# Patient Record
Sex: Male | Born: 1985 | Hispanic: Yes | Marital: Single | Smoking: Current every day smoker
Health system: Southern US, Community
[De-identification: ages and names within clinical notes are randomized; demographics above are authoritative.]

## PROBLEM LIST (undated history)

## (undated) DIAGNOSIS — F1110 Opioid abuse, uncomplicated: Secondary | ICD-10-CM

---

## 2008-05-28 ENCOUNTER — Emergency Department (HOSPITAL_COMMUNITY): Admission: EM | Admit: 2008-05-28 | Discharge: 2008-05-28 | Payer: Self-pay | Admitting: Emergency Medicine

## 2009-03-14 ENCOUNTER — Emergency Department (HOSPITAL_COMMUNITY): Admission: EM | Admit: 2009-03-14 | Discharge: 2009-03-14 | Payer: Self-pay | Admitting: Family Medicine

## 2009-08-11 ENCOUNTER — Emergency Department (HOSPITAL_COMMUNITY): Admission: EM | Admit: 2009-08-11 | Discharge: 2009-08-11 | Payer: Self-pay | Admitting: Emergency Medicine

## 2009-09-08 ENCOUNTER — Emergency Department (HOSPITAL_COMMUNITY): Admission: EM | Admit: 2009-09-08 | Discharge: 2009-09-08 | Payer: Self-pay | Admitting: Family Medicine

## 2009-11-22 ENCOUNTER — Emergency Department (HOSPITAL_COMMUNITY): Admission: EM | Admit: 2009-11-22 | Discharge: 2009-11-22 | Payer: Self-pay | Admitting: Emergency Medicine

## 2009-12-18 ENCOUNTER — Emergency Department (HOSPITAL_COMMUNITY): Admission: EM | Admit: 2009-12-18 | Discharge: 2009-12-18 | Payer: Self-pay | Admitting: Emergency Medicine

## 2010-01-18 ENCOUNTER — Emergency Department (HOSPITAL_COMMUNITY): Admission: EM | Admit: 2010-01-18 | Discharge: 2010-01-18 | Payer: Self-pay | Admitting: Emergency Medicine

## 2010-02-25 ENCOUNTER — Emergency Department (HOSPITAL_COMMUNITY): Admission: EM | Admit: 2010-02-25 | Discharge: 2010-02-26 | Payer: Self-pay | Admitting: Emergency Medicine

## 2010-11-04 ENCOUNTER — Emergency Department (HOSPITAL_COMMUNITY)
Admission: EM | Admit: 2010-11-04 | Discharge: 2010-11-04 | Discharge status: Against Medical Advice | Payer: Self-pay | Attending: Emergency Medicine | Admitting: Emergency Medicine

## 2010-11-04 DIAGNOSIS — F1110 Opioid abuse, uncomplicated: Secondary | ICD-10-CM | POA: Insufficient documentation

## 2010-11-04 LAB — CBC
HCT: 43.6 % (ref 39.0–52.0)
MCH: 31.6 pg (ref 26.0–34.0)
Platelets: 227 10*3/uL (ref 150–400)
RBC: 4.93 MIL/uL (ref 4.22–5.81)

## 2010-11-04 LAB — RAPID URINE DRUG SCREEN, HOSP PERFORMED
Barbiturates: NOT DETECTED
Cocaine: POSITIVE — AB
Tetrahydrocannabinol: POSITIVE — AB

## 2010-11-04 LAB — COMPREHENSIVE METABOLIC PANEL
AST: 17 U/L (ref 0–37)
Alkaline Phosphatase: 42 U/L (ref 39–117)
BUN: 14 mg/dL (ref 6–23)
CO2: 27 mEq/L (ref 19–32)
Calcium: 9.5 mg/dL (ref 8.4–10.5)
Creatinine, Ser: 0.84 mg/dL (ref 0.4–1.5)
GFR calc Af Amer: >60 mL/min (ref >60)
Glucose, Bld: 113 mg/dL — ABNORMAL HIGH (ref 70–99)

## 2013-03-09 ENCOUNTER — Emergency Department (HOSPITAL_COMMUNITY): Payer: Self-pay

## 2013-03-09 ENCOUNTER — Encounter (HOSPITAL_COMMUNITY): Payer: Self-pay | Admitting: Neurology

## 2013-03-09 ENCOUNTER — Emergency Department (HOSPITAL_COMMUNITY)
Admission: EM | Admit: 2013-03-09 | Discharge: 2013-03-11 | Disposition: A | Discharge status: Other Acute Inpatient Hospital | Payer: Self-pay | Attending: Emergency Medicine | Admitting: Emergency Medicine

## 2013-03-09 DIAGNOSIS — F1910 Other psychoactive substance abuse, uncomplicated: Secondary | ICD-10-CM

## 2013-03-09 DIAGNOSIS — F1110 Opioid abuse, uncomplicated: Secondary | ICD-10-CM | POA: Insufficient documentation

## 2013-03-09 DIAGNOSIS — F1193 Opioid use, unspecified with withdrawal: Secondary | ICD-10-CM

## 2013-03-09 DIAGNOSIS — F17200 Nicotine dependence, unspecified, uncomplicated: Secondary | ICD-10-CM | POA: Insufficient documentation

## 2013-03-09 DIAGNOSIS — R443 Hallucinations, unspecified: Secondary | ICD-10-CM | POA: Insufficient documentation

## 2013-03-09 DIAGNOSIS — F1123 Opioid dependence with withdrawal: Secondary | ICD-10-CM

## 2013-03-09 DIAGNOSIS — F1994 Other psychoactive substance use, unspecified with psychoactive substance-induced mood disorder: Secondary | ICD-10-CM

## 2013-03-09 DIAGNOSIS — R441 Visual hallucinations: Secondary | ICD-10-CM

## 2013-03-09 HISTORY — DX: Opioid abuse, uncomplicated: F11.10

## 2013-03-09 LAB — URINALYSIS, ROUTINE W REFLEX MICROSCOPIC
Bilirubin Urine: NEGATIVE
Glucose, UA: NEGATIVE mg/dL
Hgb urine dipstick: NEGATIVE
Ketones, ur: NEGATIVE mg/dL
Leukocytes, UA: NEGATIVE
Nitrite: NEGATIVE
Protein, ur: NEGATIVE mg/dL
Specific Gravity, Urine: 1.021 (ref 1.005–1.030)
Urobilinogen, UA: 1 mg/dL (ref 0.0–1.0)
pH: 7.5 (ref 5.0–8.0)

## 2013-03-09 LAB — BASIC METABOLIC PANEL WITH GFR
BUN: 14 mg/dL (ref 6–23)
Calcium: 9.6 mg/dL (ref 8.4–10.5)
Chloride: 101 meq/L (ref 96–112)
Glucose, Bld: 112 mg/dL — ABNORMAL HIGH (ref 70–99)
Potassium: 3.9 meq/L (ref 3.5–5.1)
Sodium: 139 meq/L (ref 135–145)

## 2013-03-09 LAB — CBC WITH DIFFERENTIAL/PLATELET
Basophils Absolute: 0 K/uL (ref 0.0–0.1)
Basophils Relative: 0 % (ref 0–1)
Eosinophils Absolute: 0 K/uL (ref 0.0–0.7)
Eosinophils Relative: 0 % (ref 0–5)
HCT: 45.6 % (ref 39.0–52.0)
Hemoglobin: 15.6 g/dL (ref 13.0–17.0)
Lymphocytes Relative: 15 % (ref 12–46)
Lymphs Abs: 1.4 K/uL (ref 0.7–4.0)
MCH: 29.9 pg (ref 26.0–34.0)
MCHC: 34.2 g/dL (ref 30.0–36.0)
MCV: 87.5 fL (ref 78.0–100.0)
Monocytes Absolute: 0.6 10*3/uL (ref 0.1–1.0)
Monocytes Relative: 7 % (ref 3–12)
Neutro Abs: 7.1 K/uL (ref 1.7–7.7)
Neutrophils Relative %: 78 % — ABNORMAL HIGH (ref 43–77)
Platelets: 229 10*3/uL (ref 150–400)
RBC: 5.21 MIL/uL (ref 4.22–5.81)
RDW: 13.3 % (ref 11.5–15.5)
WBC: 9.2 K/uL (ref 4.0–10.5)

## 2013-03-09 LAB — RAPID URINE DRUG SCREEN, HOSP PERFORMED
Amphetamines: NOT DETECTED
Barbiturates: NOT DETECTED
Benzodiazepines: NOT DETECTED
Cocaine: NOT DETECTED
Opiates: POSITIVE — AB
Tetrahydrocannabinol: NOT DETECTED

## 2013-03-09 LAB — BASIC METABOLIC PANEL
CO2: 30 mEq/L (ref 19–32)
Creatinine, Ser: 0.6 mg/dL (ref 0.50–1.35)
GFR calc Af Amer: >90 mL/min (ref >90)
GFR calc non Af Amer: >90 mL/min (ref >90)

## 2013-03-09 LAB — ETHANOL: Alcohol, Ethyl (B): <11 mg/dL (ref 0–11)

## 2013-03-09 MED ORDER — LORAZEPAM 1 MG PO TABS: 0.0000 mg | ORAL_TABLET | Freq: Two times a day (BID) | ORAL | Status: DC

## 2013-03-09 MED ORDER — ZIPRASIDONE HCL 20 MG PO CAPS
20.0000 mg | ORAL_CAPSULE | Freq: Two times a day (BID) | ORAL | Status: DC
  Administered 2013-03-09 – 2013-03-11 (×4): 20 mg via ORAL
  Filled 2013-03-09 (×4): qty 1

## 2013-03-09 MED ORDER — LORAZEPAM 1 MG PO TABS: 2.0000 mg | ORAL_TABLET | Freq: Once | ORAL | Status: DC

## 2013-03-09 MED ORDER — LORAZEPAM 2 MG/ML IJ SOLN
2.0000 mg | Freq: Once | INTRAMUSCULAR | Status: AC
  Administered 2013-03-09: 2 mg via INTRAMUSCULAR
  Filled 2013-03-09: qty 1

## 2013-03-09 MED ORDER — LORAZEPAM 2 MG/ML IJ SOLN
1.0000 mg | Freq: Four times a day (QID) | INTRAMUSCULAR | Status: DC | PRN
  Filled 2013-03-09: qty 1

## 2013-03-09 MED ORDER — IBUPROFEN 200 MG PO TABS: 600.0000 mg | ORAL_TABLET | Freq: Three times a day (TID) | ORAL | Status: DC | PRN

## 2013-03-09 MED ORDER — CLONIDINE HCL 0.1 MG PO TABS
0.1000 mg | ORAL_TABLET | Freq: Two times a day (BID) | ORAL | Status: DC
  Administered 2013-03-09 – 2013-03-11 (×5): 0.1 mg via ORAL
  Filled 2013-03-09 (×5): qty 1

## 2013-03-09 MED ORDER — DIPHENHYDRAMINE HCL 50 MG/ML IJ SOLN
50.0000 mg | Freq: Once | INTRAMUSCULAR | Status: AC
  Administered 2013-03-09: 50 mg via INTRAMUSCULAR
  Filled 2013-03-09: qty 1

## 2013-03-09 MED ORDER — NICOTINE 21 MG/24HR TD PT24
21.0000 mg | MEDICATED_PATCH | Freq: Every day | TRANSDERMAL | Status: DC
  Administered 2013-03-09 – 2013-03-11 (×3): 21 mg via TRANSDERMAL
  Filled 2013-03-09 (×3): qty 1

## 2013-03-09 MED ORDER — ONDANSETRON HCL 4 MG PO TABS: 4.0000 mg | ORAL_TABLET | Freq: Three times a day (TID) | ORAL | Status: DC | PRN

## 2013-03-09 MED ORDER — HALOPERIDOL LACTATE 5 MG/ML IJ SOLN
5.0000 mg | Freq: Once | INTRAMUSCULAR | Status: AC
  Administered 2013-03-09: 5 mg via INTRAMUSCULAR
  Filled 2013-03-09: qty 1

## 2013-03-09 MED ORDER — ALUM & MAG HYDROXIDE-SIMETH 200-200-20 MG/5ML PO SUSP: 30.0000 mL | ORAL | Status: DC | PRN

## 2013-03-09 MED ORDER — LORAZEPAM 1 MG PO TABS
2.0000 mg | ORAL_TABLET | Freq: Once | ORAL | Status: AC
  Administered 2013-03-09: 2 mg via ORAL
  Filled 2013-03-09: qty 2

## 2013-03-09 MED ORDER — LORAZEPAM 1 MG PO TABS
1.0000 mg | ORAL_TABLET | Freq: Four times a day (QID) | ORAL | Status: DC | PRN
  Filled 2013-03-09: qty 1

## 2013-03-09 MED ORDER — LORAZEPAM 2 MG/ML IJ SOLN: 2.0000 mg | Freq: Once | INTRAMUSCULAR | Status: DC

## 2013-03-09 MED ORDER — ZIPRASIDONE MESYLATE 20 MG IM SOLR
20.0000 mg | Freq: Once | INTRAMUSCULAR | Status: AC
  Administered 2013-03-09: 20 mg via INTRAMUSCULAR
  Filled 2013-03-09: qty 20

## 2013-03-09 MED ORDER — LORAZEPAM 2 MG/ML IJ SOLN
2.0000 mg | Freq: Once | INTRAMUSCULAR | Status: AC
  Administered 2013-03-09: 2 mg via INTRAVENOUS

## 2013-03-09 MED ORDER — ACETAMINOPHEN 325 MG PO TABS
650.0000 mg | ORAL_TABLET | ORAL | Status: DC | PRN
Start: 2013-03-09 — End: 2013-03-11

## 2013-03-09 MED ORDER — LORAZEPAM 1 MG PO TABS
0.0000 mg | ORAL_TABLET | Freq: Four times a day (QID) | ORAL | Status: AC
  Administered 2013-03-09 – 2013-03-10 (×4): 1 mg via ORAL
  Filled 2013-03-09 (×4): qty 1

## 2013-03-09 NOTE — ED Notes (Signed)
Ordered patient a meal tray. Pt restless, ambulatory to bathroom.

## 2013-03-09 NOTE — ED Notes (Signed)
Pt requesting detox from heroin. Reports legs are restless, denies SI or HI. Pt is calm and cooperative.

## 2013-03-09 NOTE — BH Assessment (Signed)
Tele Assessment Note   Victor Adkins is an 27 y.o. male that presented to [facility] ED requesting detox from heroin.  Pt was assessed via tele assessment by this clinician.  Pt was only oriented to person.  Pt was moving about the room and had to constantly be redirected.  Pt's speech was slurred and incoherent.  Pt appeared to be responding to internal stimuli.  Pt did report visual hallucinations to EDP Ghim.  However, pt was cooperative during assessment.  Pt stated he did want detox from heroin.  Pt stated he last used yesterday, and snorts the heroin.  Pt stated he uses daily, but could not identify the amount used.  Pt stated he has been in detox or treatment in the past in 2003, but could not recall the facility.  Pt denies SI or HI.  Pt denies a history of mental health treatment.  Pt denies any use of other substances.  Received call from pt's nurse stating he was moving around in ED and unable to stay in his room.  EDP Ghim and Dr. Lucianne Muss collaborated to order Geodon for the pt.  Pt stated he is unemployed currently and lives with his girlfriend and 1 yr old child.  This Clinical research associate was unable to gather any other information from the pt at this time, as he was unable to participate in full assessment.  Pt not appropriate for [facility] or [facility], as he is reporting auditory hallucinations.  Laural Benes, [facility] [place], who stated beds available.  Referral faxed to [facility] for review.  Updated pt's nurse in ED.  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 Heroin abuse    Axis IV: occupational problems, other psychosocial or environmental problems, problems related to legal system/crime and problems with access to health care services Axis V: 21-30 behavior considerably influenced by delusions or hallucinations OR serious impairment in judgment, communication OR inability to function in almost all areas  Past  Medical History:  Past Medical History  Diagnosis Date  . Heroin abuse     History reviewed. No pertinent past surgical history.  Family History: History reviewed. No pertinent family history.  Social History:  reports that he has been smoking.  He does not have any smokeless tobacco history on file. He reports that  drinks alcohol. He reports that he uses illicit drugs (Heroin).  Additional Social History:  Alcohol / Drug Use Pain Medications: Unknown Prescriptions: Unknown Over the Counter: Unknown History of alcohol / drug use?: Yes Longest period of sobriety (when/how long): Unknown Negative Consequences of Use:  (UTA) Withdrawal Symptoms:  (UTA) Substance #1 Name of Substance 1: Heroin 1 - Age of First Use: UTA 1 - Amount (size/oz): UTA 1 - Frequency: UTA 1 - Duration: UTA 1 - Last Use / Amount: Yesterday per pt - Unknown amount  CIWA: CIWA-Ar BP: 104/85 mmHg Pulse Rate: 87 Nausea and Vomiting: no nausea and no vomiting Tactile Disturbances: moderate itching, pins and needles, burning or numbness Tremor: no tremor Auditory Disturbances: not present Paroxysmal Sweats: no sweat visible Visual Disturbances: mild sensitivity Anxiety: three Headache, Fullness in Head: none present Agitation: two Orientation and Clouding of Sensorium: oriented and can do serial additions CIWA-Ar Total: 10 COWS: Clinical Opiate Withdrawal Scale (COWS) Resting Pulse Rate: Pulse Rate 81-100 Sweating: Subjective report of chills or flushing Restlessness: Unable to sit still for more than a few seconds Pupil Size: Pupils possibly  larger than normal for room light Bone or Joint Aches: Mild diffuse discomfort Runny Nose or Tearing: Nasal stuffiness or unusually moist eyes GI Upset: No GI symptoms Tremor: No tremor Yawning: No yawning Anxiety or Irritability: Patient so irritable or anxious that participation in the assessment is difficult Gooseflesh Skin: Skin is smooth COWS Total Score:  14  Allergies:  Allergies  Allergen Reactions  . Amoxicillin Rash    Home Medications:  (Not in a hospital admission)  OB/GYN Status:  No LMP for male patient.  General Assessment Data Location of Assessment: [facility] ED Is this a Tele or Face-to-Face Assessment?: Tele Assessment Is this an Initial Assessment or a Re-assessment for this encounter?: Initial Assessment Living Arrangements: Spouse/significant other (Girlfriend and daughter) Can pt return to current living arrangement?: Yes Admission Status: Voluntary Is patient capable of signing voluntary admission?: No Transfer from: Acute Hospital Referral Source: Self/Family/Friend  Education Status Is patient currently in school?: No  Risk to self Suicidal Ideation: No Suicidal Intent: No Is patient at risk for suicide?: No Suicidal Plan?: No Access to Means: No What has been your use of drugs/alcohol within the last 12 months?: Pt admits to heroin use Previous Attempts/Gestures: No (pt denies) How many times?: 0 (pt denies) Other Self Harm Risks: pt denies Triggers for Past Attempts: None known Intentional Self Injurious Behavior: Damaging Comment - Self Injurious Behavior: SA  Family Suicide History: Unable to assess Recent stressful life event(s):  (UTA) Persecutory voices/beliefs?:  (Pt appeared to be responding to internal stimuli) Depression:  (UTA) Depression Symptoms:  (UTA) Substance abuse history and/or treatment for substance abuse?: Yes Suicide prevention information given to non-admitted patients: Not applicable  Risk to Others Homicidal Ideation: No Thoughts of Harm to Others: No Current Homicidal Intent: No Current Homicidal Plan: No Access to Homicidal Means: No Identified Victim: pt denies History of harm to others?:  (UTA) Assessment of Violence: None Noted Violent Behavior Description: UTA Does patient have access to weapons?:  (UTA) Criminal Charges Pending?: Yes Describe Pending Criminal  Charges: Driving without a license per pt Does patient have a court date: Yes Court Date: 03/09/13  Psychosis Hallucinations:  (pt appears to be responding to internal stimuli) Delusions:  (UTA)  Mental Status Report Appear/Hygiene: Bizarre;Disheveled Eye Contact: Poor Motor Activity: Restlessness Speech: Slurred;Incoherent Level of Consciousness: Drowsy Mood: Preoccupied Affect: Blunted Anxiety Level:  (UTA) Thought Processes: Irrelevant Judgement: Impaired Orientation: Person Obsessive Compulsive Thoughts/Behaviors:  (UTA)  Cognitive Functioning Concentration:  (UTA) Memory:  (UTA) IQ:  (UTA) Insight:  (UTA) Impulse Control:  (UTA) Appetite:  (UTA) Weight Loss:  (Unknown) Weight Gain:  (Unknown) Sleep:  (UTA) Total Hours of Sleep:  (UTA) Vegetative Symptoms:  (UTA)  ADLScreening [facility] Assessment Services) Patient's cognitive ability adequate to safely complete daily activities?:  (UTA) Patient able to express need for assistance with ADLs?: Yes Independently performs ADLs?: Yes (appropriate for developmental age)  Prior Inpatient Therapy Prior Inpatient Therapy: Yes Prior Therapy Dates: 2003 Prior Therapy Facilty/Provider(s): Unknown Reason for Treatment: Detox/treatment  Prior Outpatient Therapy Prior Outpatient Therapy: No (pt denies) Prior Therapy Dates:  (na) Prior Therapy Facilty/Provider(s):  (na) Reason for Treatment:  (na)  ADL Screening (condition at time of admission) Patient's cognitive ability adequate to safely complete daily activities?:  (UTA) Is the patient deaf or have difficulty hearing?: No Does the patient have difficulty seeing, even when wearing glasses/contacts?:  (UTA) Does the patient have difficulty concentrating, remembering, or making decisions?: Yes Patient able to express need for assistance with  ADLs?: Yes Does the patient have difficulty dressing or bathing?: No Independently performs ADLs?: Yes (appropriate for developmental  age) Does the patient have difficulty walking or climbing stairs?: No Weakness of Legs: None Weakness of Arms/Hands: None  Home Assistive Devices/Equipment Home Assistive Devices/Equipment: None    Abuse/Neglect Assessment (Assessment to be complete while patient is alone) Physical Abuse:  (UTA) Verbal Abuse:  (UTA) Sexual Abuse:  (UTA) Exploitation of patient/patient's resources:  (UTA) Self-Neglect:  (UTA) Possible abuse reported to::  (UTA) Values / Beliefs Cultural Requests During Hospitalization: None Spiritual Requests During Hospitalization: None Consults Spiritual Care Consult Needed: No Social Work Consult Needed: No Merchant navy officer (For Healthcare) Advance Directive:  (UTA) Pre-existing out of facility DNR order (yellow form or pink MOST form):  (UTA)    Additional Information 1:1 In Past 12 Months?:  (UTA) CIRT Risk: No Elopement Risk: No Does patient have medical clearance?: Yes     Disposition:  Disposition Initial Assessment Completed for this Encounter: Yes Disposition of Patient: Referred to;Inpatient treatment program Type of inpatient treatment program: Adult Patient referred to: Other (Comment) Berton Lan)  Caryl Comes 03/09/2013 12:23 PM

## 2013-03-09 NOTE — ED Notes (Signed)
Pt remains agitated, confused, standing on stretcher, pushing at [facility] officer.  Dr Renae Gloss notified, PA Karleen Hampshire notified.

## 2013-03-09 NOTE — ED Notes (Signed)
Pt wanded by security and in paper scrubs.  

## 2013-03-09 NOTE — ED Notes (Signed)
1:1 w/staff at bedside, is becoming more acquainted w/environment and staff, understanding better what is going on and where he is, is staying in bed resting, gets up OOB from time to time but isaable to lay back down w/encouragement.

## 2013-03-09 NOTE — ED Notes (Signed)
Sitter placed at bedside for pt safety and to provide safety and privacy for other patients on unit

## 2013-03-09 NOTE — ED Notes (Signed)
Pt is transfer from [facility], presents for Detox from Heroin, psychosis induced with hallucinations.  Pt is confused, sitter at bedside at present. 1-1.

## 2013-03-09 NOTE — ED Notes (Signed)
PATIENT CONTINUES TO WANT TO WALK THE HALLS. AT ONE POINT TALKS ABOUT GOING TO [facility].

## 2013-03-09 NOTE — ED Notes (Signed)
Reported pt continued behavior and hallucinations to dr ghim. Med order given. Will send pt to ct when he can be calm and cooperative

## 2013-03-09 NOTE — ED Provider Notes (Addendum)
CSN: [id number]     Arrival date & time 03/09/13  6213 History     First MD Initiated Contact with Patient 03/09/13 0740     Chief Complaint  Patient presents with  . Medical Clearance   (Consider location/radiation/quality/duration/timing/severity/associated sxs/prior Treatment) HPI Comments: Pt is abusing heroin, used heroin probably laced with new acetyl-fentanyl and was told by his GF that he turned blue several nights ago, he was worried that he almost died.  He has been trying to use less and less and wean himself.  He used one dose last night and one the night before.  He cannot sleep, has restless legs sensation, discomfort diffusely, but mostly in legs.  No N/V.  No fevers, coughing.  No depression, SI, HI.  Has not tried to detox in the past.  Has been using for about 6 months.  He is otherwise healthy.  Patient is a 27 y.o. male presenting with mental health disorder. The history is provided by the patient.  Mental Health Problem Presenting symptoms: no depression, no hallucinations, no homicidal ideas, no suicidal thoughts, no suicidal threats and no suicide attempt   Progression:  Unchanged Chronicity:  Chronic Context: drug abuse   Relieved by:  Nothing Associated symptoms: anxiety   Associated symptoms: no abdominal pain     Past Medical History  Diagnosis Date  . Heroin abuse    History reviewed. No pertinent past surgical history. History reviewed. No pertinent family history. History  Substance Use Topics  . Smoking status: Current Every Day Smoker  . Smokeless tobacco: Not on file  . Alcohol Use: Yes     Comment: Occasional    Review of Systems  Constitutional: Negative for fever and chills.  HENT: Negative for sore throat.   Gastrointestinal: Negative for nausea, vomiting and abdominal pain.  Musculoskeletal: Positive for myalgias.  Psychiatric/Behavioral: Negative for suicidal ideas, homicidal ideas and hallucinations. The patient is nervous/anxious.    All other systems reviewed and are negative.    Allergies  Amoxicillin  Home Medications  No current outpatient prescriptions on file. BP 104/70  Pulse 87  Temp(Src) 98.9 F (37.2 C) (Oral)  Resp 18  SpO2 98% Physical Exam  Nursing note and vitals reviewed. Constitutional: He is oriented to person, place, and time. Vital signs are normal. He appears well-developed and well-nourished.  Non-toxic appearance. He does not have a sickly appearance. He does not appear ill. No distress.  HENT:  Head: Normocephalic and atraumatic.  Eyes: Conjunctivae and EOM are normal. No scleral icterus.  Neck: Normal range of motion. Neck supple.  Cardiovascular: Normal rate, regular rhythm and intact distal pulses.   No murmur heard. Pulmonary/Chest: Effort normal. He has no wheezes.  Abdominal: Soft. He exhibits no distension. There is no tenderness.  Musculoskeletal: He exhibits no edema and no tenderness.  Neurological: He is alert and oriented to person, place, and time. He exhibits normal muscle tone. Coordination normal.  Skin: Skin is warm and dry. He is not diaphoretic.  Psychiatric: His behavior is normal. Judgment and thought content normal. His mood appears anxious. His speech is not rapid and/or pressured, not delayed, not tangential and not slurred. Cognition and memory are normal. He is communicative.    ED Course   Procedures (including critical care time)  Labs Reviewed  CBC WITH DIFFERENTIAL - Abnormal; Notable for the following:    Neutrophils Relative % 78 (*)    All other components within normal limits  BASIC METABOLIC PANEL - Abnormal; Notable  for the following:    Glucose, Bld 112 (*)    All other components within normal limits  URINALYSIS, ROUTINE W REFLEX MICROSCOPIC - Abnormal; Notable for the following:    APPearance HAZY (*)    All other components within normal limits  URINE RAPID DRUG SCREEN (HOSP PERFORMED) - Abnormal; Notable for the following:    Opiates  POSITIVE (*)    All other components within normal limits  ETHANOL   Ct Head Wo Contrast  03/09/2013   *RADIOLOGY REPORT*  Clinical Data: Altered mental status with hallucinations; heroin withdrawal  CT HEAD WITHOUT CONTRAST  Technique:  Contiguous axial images were obtained from the base of the skull through the vertex without contrast.  Comparison: None.  Findings: Moderate motion artifact makes this study less than optimal.  The ventricles appear normal in size and configuration. There is no apparent mass, hemorrhage, extra-axial fluid collection, or midline shift.  No gray-white compartment lesions are identified on this study.  No acute infarct is appreciable. New para bony calvarium appears intact.  The mastoid air cells are clear.  IMPRESSION: No focal lesion identified.  Note that this study is less than optimal due to intermittent patient motion.   Original Report Authenticated By: Bretta Bang, M.D.   1. Heroin withdrawal   2. Visual hallucination     RA sat is 99% and I interpret to be normal   9:48 AM Pt is still anxious, apparently now is having visual hallucinations per RN.  Pt was not with me.  This may be DT's, similar to alcohol DT's.     12:20 PM Pt is till wandering, difficult to redirect although eventually he will.  Doesn't seem to pose a risk or danger to self, but has been intrusive, wandering into other patients' rooms.  Awaiting recommendations from Telepsych and ACT placement recommendations.   12:27 PM Spoke to Dr. Lucianne Muss with psychaitry who recommends Geodon 20 mg BID, first dose can be IM, recommends EKG to establish baseline due to h/o QT prolongation with geodon.    3:17 PM Head CT is unremarkable, Dr. Lucianne Muss is accepting pt to the [facility] ED as a psych patient due to acute psychosis, likely due to the acute heroin withdrawal MDM  Pt with no acute psychosis, desires detox for heroin addiction.  Pt is displaying some withdrawal symptoms already,  Will  monitor, CIWA, ativan, clonidine treatment here.  Monitor, speak to ACT regarding possible placement.  Pt has never been to treatment facility in the past. No other medical complaints, not stemming from withdrawal.  Will place in pod C holding.      Gavin Pound. Oletta Lamas, MD 03/09/13 1610  Gavin Pound. Oletta Lamas, MD 03/09/13 9604  Gavin Pound. Oletta Lamas, MD 03/09/13 1222  Gavin Pound. Oletta Lamas, MD 03/09/13 1227  Gavin Pound. Neville Pauls, MD 03/09/13 1517

## 2013-03-09 NOTE — ED Notes (Signed)
DR Lucianne Muss WOULD LIKE CT COMPLETED AND THEN TO ARRANGE FOR PT TO MOVE TO [facility] ED.

## 2013-03-09 NOTE — ED Notes (Signed)
Patient continues with visual hallucinations. Continues wandering about and needing constant redirection and supervision. Dr Oletta Lamas spoke with dr Lucianne Muss and has ordered meds for pt

## 2013-03-09 NOTE — ED Notes (Signed)
Pt restless, pacing back and forth in room. States can't sit still, legs are restless.

## 2013-03-09 NOTE — ED Notes (Signed)
Reported pt visual hallucinations to dr ghim.. Also reported pt inability to be still.

## 2013-03-09 NOTE — ED Notes (Addendum)
PATIENT CONTINUES TO CLIMB OUT OF BED. HIS GAIT IS UNSTEADY. PT IS FALL RISK. SECURITY HERE TO ASSIST WITH PT DUE TO HIS REFUSAL TO STAY IN ROOM OR LIE DOWN SINCE GETTING GEODON. DR Lucianne Muss AT [facility] HAS BEEN CALLED FOR FURTHER TREATMENT GUIDANCE

## 2013-03-10 ENCOUNTER — Encounter (HOSPITAL_COMMUNITY): Payer: Self-pay | Admitting: Registered Nurse

## 2013-03-10 MED ORDER — LORAZEPAM 2 MG/ML IJ SOLN
2.0000 mg | Freq: Once | INTRAMUSCULAR | Status: AC
  Administered 2013-03-10: 2 mg via INTRAMUSCULAR
  Filled 2013-03-10: qty 1

## 2013-03-10 MED ORDER — HALOPERIDOL LACTATE 5 MG/ML IJ SOLN
5.0000 mg | Freq: Once | INTRAMUSCULAR | Status: AC
  Administered 2013-03-10: 5 mg via INTRAMUSCULAR
  Filled 2013-03-10: qty 1

## 2013-03-10 MED ORDER — LOPERAMIDE HCL 2 MG PO CAPS
2.0000 mg | ORAL_CAPSULE | ORAL | Status: DC | PRN
  Administered 2013-03-10 (×2): 2 mg via ORAL
  Filled 2013-03-10 (×2): qty 1

## 2013-03-10 MED ORDER — ZOLPIDEM TARTRATE 10 MG PO TABS
10.0000 mg | ORAL_TABLET | Freq: Every evening | ORAL | Status: DC | PRN
  Administered 2013-03-10: 10 mg via ORAL
  Filled 2013-03-10: qty 1

## 2013-03-10 NOTE — ED Notes (Signed)
As per PA Spencer, restraints released.  Circ checks intact.

## 2013-03-10 NOTE — Consult Note (Signed)
Reason for Consult: Evaluation for inpatient treatment Referring Physician:  EDP  Victor Adkins is an 27 y.o. male.  HPI:  Patient present to [facility] requesting heroin detox.  Patient states that he used 80 cc of heroin.  Patient states that he has a daughter and lives with girlfriend.  Patient states that he wants to stop using drugs.  Patient speech continues to be slurred and low volume voice.  Patient denies hallucinations.  When waking patient has confusion of where he is and states that when he first wakes he hears his daughters voice.    Past Medical History  Diagnosis Date  . Heroin abuse     History reviewed. No pertinent past surgical history.  History reviewed. No pertinent family history.  Social History:  reports that he has been smoking.  He does not have any smokeless tobacco history on file. He reports that  drinks alcohol. He reports that he uses illicit drugs (Heroin).  Allergies:  Allergies  Allergen Reactions  . Amoxicillin Rash    Medications: I have reviewed the patient's current medications.  Results for orders placed during the hospital encounter of 03/09/13 (from the past 48 hour(s))  CBC WITH DIFFERENTIAL     Status: Abnormal   Collection Time    03/09/13  8:00 AM      Result Value Range   WBC 9.2  4.0 - 10.5 K/uL   RBC 5.21  4.22 - 5.81 MIL/uL   Hemoglobin 15.6  13.0 - 17.0 g/dL   HCT 16.1  09.6 - 04.5 %   MCV 87.5  78.0 - 100.0 fL   MCH 29.9  26.0 - 34.0 pg   MCHC 34.2  30.0 - 36.0 g/dL   RDW 40.9  81.1 - 91.4 %   Platelets 229  150 - 400 K/uL   Neutrophils Relative % 78 (*) 43 - 77 %   Neutro Abs 7.1  1.7 - 7.7 K/uL   Lymphocytes Relative 15  12 - 46 %   Lymphs Abs 1.4  0.7 - 4.0 K/uL   Monocytes Relative 7  3 - 12 %   Monocytes Absolute 0.6  0.1 - 1.0 K/uL   Eosinophils Relative 0  0 - 5 %   Eosinophils Absolute 0.0  0.0 - 0.7 K/uL   Basophils Relative 0  0 - 1 %   Basophils Absolute 0.0  0.0 - 0.1 K/uL  BASIC METABOLIC PANEL      Status: Abnormal   Collection Time    03/09/13  8:00 AM      Result Value Range   Sodium 139  135 - 145 mEq/L   Potassium 3.9  3.5 - 5.1 mEq/L   Chloride 101  96 - 112 mEq/L   CO2 30  19 - 32 mEq/L   Glucose, Bld 112 (*) 70 - 99 mg/dL   BUN 14  6 - 23 mg/dL   Creatinine, Ser 7.82  0.50 - 1.35 mg/dL   Calcium 9.6  8.4 - 95.6 mg/dL   GFR calc non Af Amer >90  >90 mL/min   GFR calc Af Amer >90  >90 mL/min   Comment:            The eGFR has been calculated     using the CKD EPI equation.     This calculation has not been     validated in all clinical     situations.     eGFR's persistently     <90 mL/min  signify     possible Chronic Kidney Disease.  ETHANOL     Status: None   Collection Time    03/09/13  8:00 AM      Result Value Range   Alcohol, Ethyl (B) <11  0 - 11 mg/dL   Comment:            LOWEST DETECTABLE LIMIT FOR     SERUM ALCOHOL IS 11 mg/dL     FOR MEDICAL PURPOSES ONLY  URINALYSIS, ROUTINE W REFLEX MICROSCOPIC     Status: Abnormal   Collection Time    03/09/13  8:34 AM      Result Value Range   Color, Urine YELLOW  YELLOW   APPearance HAZY (*) CLEAR   Specific Gravity, Urine 1.021  1.005 - 1.030   pH 7.5  5.0 - 8.0   Glucose, UA NEGATIVE  NEGATIVE mg/dL   Hgb urine dipstick NEGATIVE  NEGATIVE   Bilirubin Urine NEGATIVE  NEGATIVE   Ketones, ur NEGATIVE  NEGATIVE mg/dL   Protein, ur NEGATIVE  NEGATIVE mg/dL   Urobilinogen, UA 1.0  0.0 - 1.0 mg/dL   Nitrite NEGATIVE  NEGATIVE   Leukocytes, UA NEGATIVE  NEGATIVE   Comment: MICROSCOPIC NOT DONE ON URINES WITH NEGATIVE PROTEIN, BLOOD, LEUKOCYTES, NITRITE, OR GLUCOSE <1000 mg/dL.  URINE RAPID DRUG SCREEN (HOSP PERFORMED)     Status: Abnormal   Collection Time    03/09/13  8:34 AM      Result Value Range   Opiates POSITIVE (*) NONE DETECTED   Cocaine NONE DETECTED  NONE DETECTED   Benzodiazepines NONE DETECTED  NONE DETECTED   Amphetamines NONE DETECTED  NONE DETECTED   Tetrahydrocannabinol NONE DETECTED   NONE DETECTED   Barbiturates NONE DETECTED  NONE DETECTED   Comment:            DRUG SCREEN FOR MEDICAL PURPOSES     ONLY.  IF CONFIRMATION IS NEEDED     FOR ANY PURPOSE, NOTIFY LAB     WITHIN 5 DAYS.                LOWEST DETECTABLE LIMITS     FOR URINE DRUG SCREEN     Drug Class       Cutoff (ng/mL)     Amphetamine      1000     Barbiturate      200     Benzodiazepine   200     Tricyclics       300     Opiates          300     Cocaine          300     THC              50    Ct Head Wo Contrast  03/09/2013   *RADIOLOGY REPORT*  Clinical Data: Altered mental status with hallucinations; heroin withdrawal  CT HEAD WITHOUT CONTRAST  Technique:  Contiguous axial images were obtained from the base of the skull through the vertex without contrast.  Comparison: None.  Findings: Moderate motion artifact makes this study less than optimal.  The ventricles appear normal in size and configuration. There is no apparent mass, hemorrhage, extra-axial fluid collection, or midline shift.  No gray-white compartment lesions are identified on this study.  No acute infarct is appreciable. New para bony calvarium appears intact.  The mastoid air cells are clear.  IMPRESSION: No focal lesion identified.  Note that  this study is less than optimal due to intermittent patient motion.   Original Report Authenticated By: Bretta Bang, M.D.    Review of Systems  Psychiatric/Behavioral: Positive for memory loss and substance abuse. Negative for depression, suicidal ideas and hallucinations. The patient is not nervous/anxious and does not have insomnia.    Blood pressure 105/76, pulse 68, temperature 98.6 F (37 C), temperature source Oral, resp. rate 18, SpO2 97.00%. Physical Exam  Constitutional: He appears well-developed.  HENT:  Head: Normocephalic.  Neck: Normal range of motion.  Respiratory: Effort normal.  Musculoskeletal: Normal range of motion.  Neurological: He is alert.  Skin: Skin is warm and  dry.  Psychiatric: His speech is delayed. He is slowed. He expresses no homicidal and no suicidal ideation.  Confusion at times of location.      Assessment/Plan: Axis I: Substance Abuse and Substance induced mood disorder Axis II: Deferred Axis III:  Past Medical History  Diagnosis Date  . Heroin abuse    Axis IV: occupational problems and problems related to social environment Axis V: 41-50 serious symptoms  Recommendation:  Inpatient detox treatment and rehab services.  Refer to [facility].  Elysia Grand 03/10/2013, 1:27 PM

## 2013-03-11 DIAGNOSIS — F1910 Other psychoactive substance abuse, uncomplicated: Secondary | ICD-10-CM

## 2013-03-11 DIAGNOSIS — F1994 Other psychoactive substance use, unspecified with psychoactive substance-induced mood disorder: Secondary | ICD-10-CM

## 2013-03-11 MED ORDER — HYDROXYZINE HCL 25 MG PO TABS: 25.0000 mg | ORAL_TABLET | Freq: Four times a day (QID) | ORAL | Status: DC | PRN

## 2013-03-11 MED ORDER — NAPROXEN 500 MG PO TABS: 500.0000 mg | ORAL_TABLET | Freq: Two times a day (BID) | ORAL | Status: DC | PRN

## 2013-03-11 MED ORDER — LOPERAMIDE HCL 2 MG PO CAPS: 2.0000 mg | ORAL_CAPSULE | ORAL | Status: DC | PRN

## 2013-03-11 MED ORDER — DICYCLOMINE HCL 20 MG PO TABS: 20.0000 mg | ORAL_TABLET | Freq: Four times a day (QID) | ORAL | Status: DC | PRN

## 2013-03-11 MED ORDER — ONDANSETRON 4 MG PO TBDP: 4.0000 mg | ORAL_TABLET | Freq: Four times a day (QID) | ORAL | Status: DC | PRN

## 2013-03-11 MED ORDER — METHOCARBAMOL 500 MG PO TABS: 500.0000 mg | ORAL_TABLET | Freq: Three times a day (TID) | ORAL | Status: DC | PRN

## 2013-03-11 NOTE — Progress Notes (Signed)
Follow up Progress Note: Face to face interview and consult with Dr Patric Dykes Fender05/23/[id number]  Subjective Patient states that he is feeling much better. Patient states that he was at hospital because he waited help with detox off of heroin; and now he is feeling better.  Patient states that he has been here and no one knows where he is.  "I have been missing for 3 days.  I can't call home cause don't have phone.  My wife doesn't know where I am cause I am suppose to be in [place].  I have a job, wife, and daughter that I need to take care of.  I am ready to go."  Patient states that he is interested in long term treatment but cant do it now.  Patient denies suicidal ideations, homicidal ideations, psychosis, and paranoia.    Current Medication Current facility-administered medications:acetaminophen (TYLENOL) tablet 650 mg, 650 mg, Oral, Q4H PRN, Gavin Pound. Ghim, MD;  alum & mag hydroxide-simeth (MAALOX/MYLANTA) 200-200-20 MG/5ML suspension 30 mL, 30 mL, Oral, PRN, Gavin Pound. Ghim, MD;  cloNIDine (CATAPRES) tablet 0.1 mg, 0.1 mg, Oral, BID, Gavin Pound. Ghim, MD, 0.1 mg at 03/11/13 1053;  dicyclomine (BENTYL) tablet 20 mg, 20 mg, Oral, Q6H PRN, Shuvon Rankin, NP hydrOXYzine (ATARAX/VISTARIL) tablet 25 mg, 25 mg, Oral, Q6H PRN, Shuvon Rankin, NP;  loperamide (IMODIUM) capsule 2-4 mg, 2-4 mg, Oral, PRN, Shuvon Rankin, NP;  methocarbamol (ROBAXIN) tablet 500 mg, 500 mg, Oral, Q8H PRN, Shuvon Rankin, NP;  naproxen (NAPROSYN) tablet 500 mg, 500 mg, Oral, BID PRN, Shuvon Rankin, NP;  nicotine (NICODERM CQ - dosed in mg/24 hours) patch 21 mg, 21 mg, Transdermal, Daily, Gavin Pound. Ghim, MD, 21 mg at 03/11/13 1058 ondansetron (ZOFRAN) tablet 4 mg, 4 mg, Oral, Q8H PRN, Gavin Pound. Ghim, MD;  ondansetron (ZOFRAN-ODT) disintegrating tablet 4 mg, 4 mg, Oral, Q6H PRN, Shuvon Rankin, NP No current outpatient prescriptions on file.    Assessment See previous Asix diagnosis Patient has improved  cognitive status.  Patient is able to participate in conversation more coherently.  No noted signs of With drawl at this time.  Plan  Recommendation: Patient may be discharged home late afternoon/evening.  Monitor until thing vital signs (B/P).  Give patient outpatient resources for long term rehab services and outpatient services.    Shuvon Rankin, FNP-BC  I have personally seen the patient and agreed with the findings and involved in the treatment plan. Kathryne Sharper, MD

## 2013-03-11 NOTE — ED Provider Notes (Signed)
2:57 PM D/w Child psychotherapist, patient has declined inpatient detox for substance abuse, will d/c and f/u as outpatient. Patient has decision making capacity and is not SI/HI, feel he is safe for discharge.  Audree Camel, MD 03/11/13 1754

## 2013-03-11 NOTE — Progress Notes (Addendum)
Per discussion with psychiatrist patient requesting to dc home and declined [facility] or inpatient detox. ACT informed [facility] of patient declining services.   Catha Gosselin, Theresia Majors  [phone] .03/11/2013 10:13am   Pt provided with outpatient substance abuse resources. Patient lives in [place], and needed assistance home. CSW will provide patient if needed. CSW left cab voucher with NP. Pt to be evaluated this afternoon for later this afternoon discharge due to recent medication.   Catha Gosselin, LCSWA  [phone] .03/11/2013 1013am

## 2013-03-13 NOTE — Consult Note (Signed)
Patient seen, evaluated and recommendations made

## 2014-04-05 ENCOUNTER — Encounter (HOSPITAL_COMMUNITY): Payer: Self-pay | Admitting: Emergency Medicine

## 2014-04-05 ENCOUNTER — Emergency Department (HOSPITAL_COMMUNITY)
Admission: EM | Admit: 2014-04-05 | Discharge: 2014-04-05 | Disposition: A | Discharge status: Home / Self Care | Payer: Self-pay | Attending: Emergency Medicine | Admitting: Emergency Medicine

## 2014-04-05 DIAGNOSIS — F17200 Nicotine dependence, unspecified, uncomplicated: Secondary | ICD-10-CM | POA: Insufficient documentation

## 2014-04-05 DIAGNOSIS — F1110 Opioid abuse, uncomplicated: Secondary | ICD-10-CM | POA: Insufficient documentation

## 2014-04-05 DIAGNOSIS — Z880 Allergy status to penicillin: Secondary | ICD-10-CM | POA: Insufficient documentation

## 2014-04-05 LAB — COMPREHENSIVE METABOLIC PANEL
ALK PHOS: 50 U/L (ref 39–117)
ALT: 15 U/L (ref 0–53)
AST: 15 U/L (ref 0–37)
Albumin: 3.1 g/dL — ABNORMAL LOW (ref 3.5–5.2)
Anion gap: 11 (ref 5–15)
BILIRUBIN TOTAL: 0.3 mg/dL (ref 0.3–1.2)
BUN: 10 mg/dL (ref 6–23)
CHLORIDE: 103 meq/L (ref 96–112)
CO2: 28 meq/L (ref 19–32)
Calcium: 9 mg/dL (ref 8.4–10.5)
Creatinine, Ser: 0.83 mg/dL (ref 0.50–1.35)
GFR calc Af Amer: >90 mL/min (ref >90)
Glucose, Bld: 109 mg/dL — ABNORMAL HIGH (ref 70–99)
POTASSIUM: 3.7 meq/L (ref 3.7–5.3)
SODIUM: 142 meq/L (ref 137–147)
Total Protein: 6.2 g/dL (ref 6.0–8.3)

## 2014-04-05 LAB — CBC
HCT: 44.1 % (ref 39.0–52.0)
Hemoglobin: 15.1 g/dL (ref 13.0–17.0)
MCH: 30.1 pg (ref 26.0–34.0)
MCHC: 34.2 g/dL (ref 30.0–36.0)
MCV: 87.8 fL (ref 78.0–100.0)
PLATELETS: 252 10*3/uL (ref 150–400)
RBC: 5.02 MIL/uL (ref 4.22–5.81)
RDW: 12.6 % (ref 11.5–15.5)
WBC: 10.2 10*3/uL (ref 4.0–10.5)

## 2014-04-05 LAB — ACETAMINOPHEN LEVEL: Acetaminophen (Tylenol), Serum: <15 ug/mL (ref 10–30)

## 2014-04-05 LAB — ETHANOL: Alcohol, Ethyl (B): <11 mg/dL (ref 0–11)

## 2014-04-05 LAB — SALICYLATE LEVEL

## 2014-04-05 MED ORDER — DICYCLOMINE HCL 20 MG PO TABS: 20.0000 mg | ORAL_TABLET | Freq: Four times a day (QID) | ORAL | Status: DC | PRN

## 2014-04-05 MED ORDER — PROMETHAZINE HCL 25 MG PO TABS: 25.0000 mg | ORAL_TABLET | Freq: Four times a day (QID) | ORAL | Status: DC | PRN

## 2014-04-05 MED ORDER — LOPERAMIDE HCL 2 MG PO CAPS: 2.0000 mg | ORAL_CAPSULE | Freq: Four times a day (QID) | ORAL | Status: DC | PRN

## 2014-04-05 NOTE — ED Provider Notes (Signed)
CSN: [id number]     Arrival date & time 04/05/14  0005 History   First MD Initiated Contact with Patient 04/05/14 0144     Chief Complaint  Patient presents with  . Detox heroin/pain pills      (Consider location/radiation/quality/duration/timing/severity/associated sxs/prior Treatment) HPI 28 year old male presents to emergency department with complaint of opiate abuse.  He reports he's been using heroin for the last 3 years.  Here estimates about a gram a day use.  Patient is unsure why he chose today to quit using heroin, but feels that it's a good day to start.  Patient also reports abusing prescription opiates.  He denies any medical problems.  He reports prior psychiatric hospitalization "I was crazy".  Patient unsure what happened during that hospitalization.  He is no longer on psychiatric medications.  Patient mentioned vague suicide ideation to triage nurse.  He denies any SI or HI to me.  He denies any visual or auditory hallucinations.  Patient last used earlier.  He has not had any side effects at this time.  From withdrawal Past Medical History  Diagnosis Date  . Heroin abuse    History reviewed. No pertinent past surgical history. No family history on file. History  Substance Use Topics  . Smoking status: Current Every Day Smoker  . Smokeless tobacco: Not on file  . Alcohol Use: Yes     Comment: Occasional    Review of Systems   See History of Present Illness; otherwise all other systems are reviewed and negative  Allergies  Amoxicillin  Home Medications   Prior to Admission medications   Medication Sig Start Date End Date Taking? Authorizing Provider  dicyclomine (BENTYL) 20 MG tablet Take 1 tablet (20 mg total) by mouth every 6 (six) hours as needed for spasms (for abdominal cramping). 04/05/14   Olivia Mackie, MD  loperamide (IMODIUM) 2 MG capsule Take 1 capsule (2 mg total) by mouth 4 (four) times daily as needed for diarrhea or loose stools. 04/05/14   Olivia Mackie, MD  promethazine (PHENERGAN) 25 MG tablet Take 1 tablet (25 mg total) by mouth every 6 (six) hours as needed for nausea. 04/05/14   Olivia Mackie, MD   BP 104/56  Pulse 66  Temp(Src) 98.2 F (36.8 C) (Oral)  Resp 18  Ht  (1.676 m)  Wt 140 lb (63.504 kg)  BMI 22.61 kg/m2  SpO2 98% Physical Exam  Nursing note and vitals reviewed. Constitutional: He is oriented to person, place, and time. He appears well-developed and well-nourished.  HENT:  Head: Normocephalic and atraumatic.  Nose: Nose normal.  Mouth/Throat: Oropharynx is clear and moist.  Eyes: Conjunctivae and EOM are normal. Pupils are equal, round, and reactive to light.  Neck: Normal range of motion. Neck supple. No JVD present. No tracheal deviation present. No thyromegaly present.  Cardiovascular: Normal rate, regular rhythm, normal heart sounds and intact distal pulses.  Exam reveals no gallop and no friction rub.   No murmur heard. Pulmonary/Chest: Effort normal and breath sounds normal. No stridor. No respiratory distress. He has no wheezes. He has no rales. He exhibits no tenderness.  Abdominal: Soft. Bowel sounds are normal. He exhibits no distension and no mass. There is no tenderness. There is no rebound and no guarding.  Musculoskeletal: Normal range of motion. He exhibits no edema and no tenderness.  Lymphadenopathy:    He has no cervical adenopathy.  Neurological: He is alert and oriented to person, place, and time.  He exhibits normal muscle tone. Coordination normal.  Skin: Skin is warm and dry. No rash noted. No erythema. No pallor.  Psychiatric: He has a normal mood and affect. His behavior is normal. Judgment and thought content normal.    ED Course  Procedures (including critical care time) Labs Review Labs Reviewed  COMPREHENSIVE METABOLIC PANEL - Abnormal; Notable for the following:    Glucose, Bld 109 (*)    Albumin 3.1 (*)    All other components within normal limits  SALICYLATE LEVEL -  Abnormal; Notable for the following:    Salicylate Lvl <2.0 (*)    All other components within normal limits  ACETAMINOPHEN LEVEL  CBC  ETHANOL    Imaging Review No results found.   EKG Interpretation None      MDM   Final diagnoses:  Opiate abuse, continuous    28 year old male with opiate abuse.  No severe withdrawal at this time.  He has no dual diagnoses that would lend towards admission.  Patient has been given prescriptions to help with his symptoms and outpatient resources.    Olivia Mackie, MD 04/05/14 [phone]

## 2014-04-05 NOTE — ED Notes (Signed)
Pt verbalized understanding of resources for substance abuse available in area.

## 2014-04-05 NOTE — ED Notes (Addendum)
Pt presents with request for assistance in detox from heroin and pain medication. Last used yesterday evening. Passive SI

## 2014-04-05 NOTE — Discharge Instructions (Signed)
Please follow up with one of the local opiate detox center or Narcotic Anonymous   Behavioral Health Resources in the [facility]  Intensive Outpatient Programs: [facility]      [address] [place], [place] [phone] Both a day and evening program       [facility]     [address]        [place], [place] [address] [phone]         [facility] [address] [place] [phone]  [facility] ACCESS LINE: [phone] or [phone] [address] [place], [place] [address] [email or web address]  Mobile Crisis Teams:                                        [facility]         Mobile Crisis Care Unit [phone]             [facility] [address] Ginette Otto [phone]                                         Interventionist [address] [place] [address] [place] [phone]  Self-Help/Support Groups: [facility] [employer] of support groups [phone] (call for more info)  Narcotics Anonymous (NA) [facility] [address] [place] - 2 meetings at this location  Residential Treatment Programs:  [facility]      [address]        [place]       [phone]         [facility] [address], [place] [address] [place], [place]  [address] [phone]  [facility]  [address] [place], [place] [address] [phone] Admissions: 8am-3pm M-F  [facility]     [address]        [place], [place] [address]       [phone]         [facility] [address] [place], [place] [phone]  [facility] [address] [place], [place] [phone]  [facility] - Intensive Outpatient      [address]     [place], [place]       [phone]         [facility]     [address] [place], [place] [phone] or [phone]  [facility] ([facility]  [address] [place], [place] [phone]  [facility] [address]                                               [address] [place] [phone]  [facility] Resources: [phone]               General Therapy  Angie Fava, PhD        [address] [place], [place] [address]         [phone]   Insurance  [facility]   [address] [place], [place] [address] [phone]  [facility] [address] [place], [place] [address] [phone] Insurance/Medicaid/sponsorship through [facility]                                              [address]                                        [place], [place] [address]    Therapy/tele-psych/case         [phone]          [facility] [address], [place]  [address]  Adolescent/group home/case management [phone]                                           Creola Corn PhD       General therapy       Insurance   [phone]         Dr. Lolly Mustache Insurance [phone] M-F  [facility], sponsorship [phone]    Opioid Use Disorder Opioid use disorder is a mental disorder. It is the continued nonmedical use of opioids in spite of risks to health and well-being. Misused opioids include the street drug heroin. They also include pain medicines such as morphine, hydrocodone, oxycodone, and fentanyl. Opioids are very addictive. People who misuse opioids get an exaggerated feeling of well-being. Opioid use disorder often disrupts activities at home, work, or school. It may cause mental or physical problems.  A family history  of opioid use disorder puts you at higher risk of it. People with opioid use disorder often misuse other drugs or have mental illness such as depression, posttraumatic stress disorder, or antisocial personality disorder. They also are at risk of suicide and death from overdose. SIGNS AND SYMPTOMS  Signs and symptoms of opioid use disorder include:  Use of opioids in larger amounts or over a longer period than intended.  Unsuccessful attempts to cut down or control opioid use.  A lot of time spent obtaining, using, or recovering from the effects of opioids.  A strong desire or urge to use opioids (craving).  Continued use of opioids in spite of major problems at work, school, or home because of use.  Continued use of opioids in spite of relationship problems because of use.  Giving up or cutting down on important life activities because of opioid use.  Use of opioids over and over in situations when it is physically hazardous, such as driving a car.  Continued use of opioids in spite of a physical problem that is likely related to use. Physical problems can include:  Severe constipation.  Poor nutrition.  Infertility.  Tuberculosis.  Aspiration  pneumonia.  Infections such as human immunodeficiency virus (HIV) and hepatitis (from injecting opioids).  Continued use of opioids in spite of a mental problem that is likely related to use. Mental problems can include:  Depression.  Anxiety.  Hallucinations.  Sleep problems.  Loss of sexual function.  Need to use more and more opioids to get the same effect, or lessened effect over time with use of the same amount (tolerance).  Having withdrawal symptoms when opioid use is stopped, or using opioids to reduce or avoid withdrawal symptoms. Withdrawal symptoms include:  Depressed, anxious, or irritable mood.  Nausea, vomiting, diarrhea, or intestinal cramping.  Muscle aches or spasms.  Excessive tearing or runny  nose.  Dilated pupils, sweating, or hairs standing on end.  Yawning.  Fever, raised blood pressure, or fast pulse.  Restlessness or trouble sleeping. This does not apply to people taking opioids for medical reasons only. DIAGNOSIS Opioid use disorder is diagnosed by your health care provider. You may be asked questions about your opioid use and and how it affects your life. A physical exam may be done. A drug screen may be ordered. You may be referred to a mental health professional. The diagnosis of opioid use disorder requires at least two symptoms within 12 months. The type of opioid use disorder you have depends on the number of signs and symptoms you have. The type may be:  Mild. Two or three signs and symptoms.   Moderate. Four or five signs and symptoms.   Severe. Six or more signs and symptoms. TREATMENT  Treatment is usually provided by mental health professionals with training in substance use disorders.The following options are available:  Detoxification.This is the first step in treatment for withdrawal. It is medically supervised withdrawal with the use of medicines. These medicines lessen withdrawal symptoms. They also raise the chance of becoming opioid free.  Counseling, also known as talk therapy. Talk therapy addresses the reasons you use opioids. It also addresses ways to keep you from using again (relapse). The goals of talk therapy are to avoid relapse by:  Identifying and avoiding triggers for use.  Finding healthy ways to cope with stress.  Learning how to handle cravings.  Support groups. Support groups provide emotional support, advice, and guidance.  A medicine that blocks opioid receptors in your brain. This medicine can reduce opioid cravings that lead to relapse. This medicine also blocks the desired opioid effect when relapse occurs.  Opioids that are taken by mouth in place of the misused opioid (opioid maintenance treatment). These medicines  satisfy cravings but are safer than commonly misused opioids. This often is the best option for people who continue to relapse with other treatments. HOME CARE INSTRUCTIONS   Take medicines only as directed by your health care provider.  Check with your health care provider before starting new medicines.  Keep all follow-up visits as directed by your health care provider. SEEK MEDICAL CARE IF:  You are not able to take your medicines as directed.  Your symptoms get worse. SEEK IMMEDIATE MEDICAL CARE IF:  You have serious thoughts about hurting yourself or others.  You may have taken an overdose of opioids. FOR MORE INFORMATION  National Institute on Drug Abuse: http://www.price-smith.com/  Substance Abuse and Mental Health Services Administration: [employer]asis[employer]com.pt Document Released: 05/20/2007 Document Revised: 12/07/2013 Document Reviewed: 08/05/2013 [facility] Patient Information 2015 [place], [place]. This information is not intended to replace advice given to you by your health care provider. Make sure you discuss any questions  you have with your health care provider.  Opioid Withdrawal Opioids are a group of narcotic drugs. They include the street drug heroin. They also include pain medicines, such as morphine, hydrocodone, oxycodone, and fentanyl. Opioid withdrawal is a group of characteristic physical and mental signs and symptoms. It typically occurs if you have been using opioids daily for several weeks or longer and stop using or rapidly decrease use. Opioid withdrawal can also occur if you have used opioids daily for a long time and are given a medicine to block the effect.  SIGNS AND SYMPTOMS Opioid withdrawal includes three or more of the following symptoms:   Depressed, anxious, or irritable mood.  Nausea or vomiting.  Muscle aches or spasms.   Watery eyes.   Runny nose.  Dilated pupils, sweating, or hairs standing on end.  Diarrhea or intestinal cramping.  Yawning.    Fever.  Increased blood pressure.  Fast pulse.  Restlessness or trouble sleeping. These signs and symptoms occur within several hours of stopping or reducing short-acting opioids, such as heroin. They can occur within 3 days of stopping or reducing long-acting opioids, such as methadone. Withdrawal begins within minutes of receiving a drug that blocks the effects of opioids, such as naltrexone or naloxone. DIAGNOSIS  Opioid use disorder is diagnosed by your health care provider. You will be asked about your symptoms, drug and alcohol use, medical history, and use of medicines. A physical exam may be done. Lab tests may be ordered. Your health care provider may have you see a mental health professional.  TREATMENT  The treatment for opioid withdrawal is usually provided by medical doctors with special training in substance use disorders (addiction specialists). The following medicines may be included in treatment:  Opioids given in place of the abused opioid. They turn on opioid receptors in the brain and lessen or prevent withdrawal symptoms. They are gradually decreased (opioid substitution and taper).  Non-opioids that can lessen certain opioid withdrawal symptoms. They may be used alone or with opioid substitution and taper. Successful long-term recovery usually requires medicine, counseling, and group support. HOME CARE INSTRUCTIONS   Take medicines only as directed by your health care provider.  Check with your health care provider before starting new medicines.  Keep all follow-up visits as directed by your health care provider. SEEK MEDICAL CARE IF:  You are not able to take your medicines as directed.  Your symptoms get worse.  You relapse. SEEK IMMEDIATE MEDICAL CARE IF:  You have serious thoughts about hurting yourself or others.  You have a seizure.  You lose consciousness. Document Released: 07/26/2003 Document Revised: 12/07/2013 Document Reviewed:  08/05/2013 [facility] Patient Information 2015 [place], [place]. This information is not intended to replace advice given to you by your health care provider. Make sure you discuss any questions you have with your health care provider.  Chemical Dependency Chemical dependency is an addiction to drugs or alcohol. It is characterized by the repeated behavior of seeking out and using drugs and alcohol despite harmful consequences to the health and safety of ones self and others.  RISK FACTORS There are certain situations or behaviors that increase a person's risk for chemical dependency. These include:  A family history of chemical dependency.  A history of mental health issues, including depression and anxiety.  A home environment where drugs and alcohol are easily available to you.  Drug or alcohol use at a young age. SYMPTOMS  The following symptoms can indicate chemical dependency:  Inability to  limit the use of drugs or alcohol.  Nausea, sweating, shakiness, and anxiety that occurs when alcohol or drugs are not being used.  An increase in amount of drugs or alcohol that is necessary to get drunk or high. People who experience these symptoms can assess their use of drugs and alcohol by asking themselves the following questions:  Have you been told by friends or family that they are worried about your use of alcohol or drugs?  Do friends and family ever tell you about things you did while drinking alcohol or using drugs that you do not remember?  Do you lie about using alcohol or drugs or about the amounts you use?  Do you have difficulty completing daily tasks unless you use alcohol or drugs?  Is the level of your work or school performance lower because of your drug or alcohol use?  Do you get sick from using drugs or alcohol but keep using anyway?  Do you feel uncomfortable in social situations unless you use alcohol or drugs?  Do you use drugs or alcohol to help forget  problems? An answer of yes to any of these questions may indicate chemical dependency. Professional evaluation is suggested. Document Released: 07/17/2001 Document Revised: 10/15/2011 Document Reviewed: 09/28/2010 [facility] Patient Information 2015 [place], [place]. This information is not intended to replace advice given to you by your health care provider. Make sure you discuss any questions you have with your health care provider.

## 2014-04-23 ENCOUNTER — Emergency Department (HOSPITAL_COMMUNITY): Payer: Self-pay

## 2014-04-23 ENCOUNTER — Emergency Department (HOSPITAL_COMMUNITY)
Admission: EM | Admit: 2014-04-23 | Discharge: 2014-04-23 | Disposition: A | Discharge status: Home / Self Care | Payer: Self-pay | Attending: Emergency Medicine | Admitting: Emergency Medicine

## 2014-04-23 ENCOUNTER — Encounter (HOSPITAL_COMMUNITY): Payer: Self-pay | Admitting: Emergency Medicine

## 2014-04-23 DIAGNOSIS — S02402A Zygomatic fracture, unspecified, initial encounter for closed fracture: Secondary | ICD-10-CM

## 2014-04-23 DIAGNOSIS — Z880 Allergy status to penicillin: Secondary | ICD-10-CM | POA: Insufficient documentation

## 2014-04-23 DIAGNOSIS — Y92410 Unspecified street and highway as the place of occurrence of the external cause: Secondary | ICD-10-CM | POA: Insufficient documentation

## 2014-04-23 DIAGNOSIS — S02400A Malar fracture unspecified, initial encounter for closed fracture: Secondary | ICD-10-CM | POA: Insufficient documentation

## 2014-04-23 DIAGNOSIS — IMO0002 Reserved for concepts with insufficient information to code with codable children: Secondary | ICD-10-CM | POA: Insufficient documentation

## 2014-04-23 DIAGNOSIS — S0993XA Unspecified injury of face, initial encounter: Secondary | ICD-10-CM | POA: Insufficient documentation

## 2014-04-23 DIAGNOSIS — Y9389 Activity, other specified: Secondary | ICD-10-CM | POA: Insufficient documentation

## 2014-04-23 DIAGNOSIS — S199XXA Unspecified injury of neck, initial encounter: Secondary | ICD-10-CM

## 2014-04-23 DIAGNOSIS — F17200 Nicotine dependence, unspecified, uncomplicated: Secondary | ICD-10-CM | POA: Insufficient documentation

## 2014-04-23 DIAGNOSIS — S02401A Maxillary fracture, unspecified, initial encounter for closed fracture: Principal | ICD-10-CM | POA: Insufficient documentation

## 2014-04-23 DIAGNOSIS — S20219A Contusion of unspecified front wall of thorax, initial encounter: Secondary | ICD-10-CM | POA: Insufficient documentation

## 2014-04-23 DIAGNOSIS — S40211A Abrasion of right shoulder, initial encounter: Secondary | ICD-10-CM

## 2014-04-23 DIAGNOSIS — S20211A Contusion of right front wall of thorax, initial encounter: Secondary | ICD-10-CM

## 2014-04-23 DIAGNOSIS — S93609A Unspecified sprain of unspecified foot, initial encounter: Secondary | ICD-10-CM | POA: Insufficient documentation

## 2014-04-23 DIAGNOSIS — S93602A Unspecified sprain of left foot, initial encounter: Secondary | ICD-10-CM

## 2014-04-23 MED ORDER — OXYCODONE-ACETAMINOPHEN 5-325 MG PO TABS: 1.0000 | ORAL_TABLET | Freq: Four times a day (QID) | ORAL | Status: DC | PRN

## 2014-04-23 MED ORDER — OXYCODONE-ACETAMINOPHEN 5-325 MG PO TABS
1.0000 | ORAL_TABLET | Freq: Once | ORAL | Status: AC
  Administered 2014-04-23: 1 via ORAL
  Filled 2014-04-23: qty 1

## 2014-04-23 NOTE — ED Notes (Signed)
Patient transported to CT 
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
## 2014-04-23 NOTE — Discharge Instructions (Signed)
Take percocet for severe pain only. No driving or operating heavy machinery while taking percocet. This medication may cause drowsiness.  Facial Fracture A facial fracture is a break in one of the bones of your face. HOME CARE INSTRUCTIONS   Protect the injured part of your face until it is healed.  Do not participate in activities which give chance for re-injury until your doctor approves.  Gently wash and dry your face.  Wear head and facial protection while riding a bicycle, motorcycle, or snowmobile. SEEK MEDICAL CARE IF:   An oral temperature above 102 F (38.9 C) develops.  You have severe headaches or notice changes in your vision.  You have new numbness or tingling in your face.  You develop nausea (feeling sick to your stomach), vomiting or a stiff neck. SEEK IMMEDIATE MEDICAL CARE IF:   You develop difficulty seeing or experience double vision.  You become dizzy, lightheaded, or faint.  You develop trouble speaking, breathing, or swallowing.  You have a watery discharge from your nose or ear. MAKE SURE YOU:   Understand these instructions.  Will watch your condition.  Will get help right away if you are not doing well or get worse. Document Released: 07/23/2005 Document Revised: 10/15/2011 Document Reviewed: 03/11/2008 [facility] Patient Information 2015 [place], [place]. This information is not intended to replace advice given to you by your health care provider. Make sure you discuss any questions you have with your health care provider.  Head Injury You have received a head injury. It does not appear serious at this time. Headaches and vomiting are common following head injury. It should be easy to awaken from sleeping. Sometimes it is necessary for you to stay in the emergency department for a while for observation. Sometimes admission to the hospital may be needed. After injuries such as yours, most problems occur within the first 24 hours, but side effects may  occur up to 7-10 days after the injury. It is important for you to carefully monitor your condition and contact your health care provider or seek immediate medical care if there is a change in your condition. WHAT ARE THE TYPES OF HEAD INJURIES? Head injuries can be as minor as a bump. Some head injuries can be more severe. More severe head injuries include:  A jarring injury to the brain (concussion).  A bruise of the brain (contusion). This mean there is bleeding in the brain that can cause swelling.  A cracked skull (skull fracture).  Bleeding in the brain that collects, clots, and forms a bump (hematoma). WHAT CAUSES A HEAD INJURY? A serious head injury is most likely to happen to someone who is in a car wreck and is not wearing a seat belt. Other causes of major head injuries include bicycle or motorcycle accidents, sports injuries, and falls. HOW ARE HEAD INJURIES DIAGNOSED? A complete history of the event leading to the injury and your current symptoms will be helpful in diagnosing head injuries. Many times, pictures of the brain, such as CT or MRI are needed to see the extent of the injury. Often, an overnight hospital stay is necessary for observation.  WHEN SHOULD I SEEK IMMEDIATE MEDICAL CARE?  You should get help right away if:  You have confusion or drowsiness.  You feel sick to your stomach (nauseous) or have continued, forceful vomiting.  You have dizziness or unsteadiness that is getting worse.  You have severe, continued headaches not relieved by medicine. Only take over-the-counter or prescription medicines for pain,  fever, or discomfort as directed by your health care provider.  You do not have normal function of the arms or legs or are unable to walk.  You notice changes in the black spots in the center of the colored part of your eye (pupil).  You have a clear or bloody fluid coming from your nose or ears.  You have a loss of vision. During the next 24 hours  after the injury, you must stay with someone who can watch you for the warning signs. This person should contact local emergency services (911 in the [place] if you have seizures, you become unconscious, or you are unable to wake up. HOW CAN I PREVENT A HEAD INJURY IN THE FUTURE? The most important factor for preventing major head injuries is avoiding motor vehicle accidents. To minimize the potential for damage to your head, it is crucial to wear seat belts while riding in motor vehicles. Wearing helmets while bike riding and playing collision sports (like football) is also helpful. Also, avoiding dangerous activities around the house will further help reduce your risk of head injury.  WHEN CAN I RETURN TO NORMAL ACTIVITIES AND ATHLETICS? You should be reevaluated by your health care provider before returning to these activities. If you have any of the following symptoms, you should not return to activities or contact sports until 1 week after the symptoms have stopped:  Persistent headache.  Dizziness or vertigo.  Poor attention and concentration.  Confusion.  Memory problems.  Nausea or vomiting.  Fatigue or tire easily.  Irritability.  Intolerant of bright lights or loud noises.  Anxiety or depression.  Disturbed sleep. MAKE SURE YOU:   Understand these instructions.  Will watch your condition.  Will get help right away if you are not doing well or get worse. Document Released: 07/23/2005 Document Revised: 07/28/2013 Document Reviewed: 03/30/2013 [facility] Patient Information 2015 [place], [place]. This information is not intended to replace advice given to you by your health care provider. Make sure you discuss any questions you have with your health care provider.  Foot Sprain The muscles and cord like structures which attach muscle to bone (tendons) that surround the feet are made up of units. A foot sprain can occur at the weakest spot in any of these units. This condition  is most often caused by injury to or overuse of the foot, as from playing contact sports, or aggravating a previous injury, or from poor conditioning, or obesity. SYMPTOMS  Pain with movement of the foot.  Tenderness and swelling at the injury site.  Loss of strength is present in moderate or severe sprains. THE THREE GRADES OR SEVERITY OF FOOT SPRAIN ARE:  Mild (Grade I): Slightly pulled muscle without tearing of muscle or tendon fibers or loss of strength.  Moderate (Grade II): Tearing of fibers in a muscle, tendon, or at the attachment to bone, with small decrease in strength.  Severe (Grade III): Rupture of the muscle-tendon-bone attachment, with separation of fibers. Severe sprain requires surgical repair. Often repeating (chronic) sprains are caused by overuse. Sudden (acute) sprains are caused by direct injury or over-use. DIAGNOSIS  Diagnosis of this condition is usually by your own observation. If problems continue, a caregiver may be required for further evaluation and treatment. X-rays may be required to make sure there are not breaks in the bones (fractures) present. Continued problems may require physical therapy for treatment. PREVENTION  Use strength and conditioning exercises appropriate for your sport.  Warm up properly prior to  working out.  Use athletic shoes that are made for the sport you are participating in.  Allow adequate time for healing. Early return to activities makes repeat injury more likely, and can lead to an unstable arthritic foot that can result in prolonged disability. Mild sprains generally heal in 3 to 10 days, with moderate and severe sprains taking 2 to 10 weeks. Your caregiver can help you determine the proper time required for healing. HOME CARE INSTRUCTIONS   Apply ice to the injury for 15-20 minutes, 03-04 times per day. Put the ice in a plastic bag and place a towel between the bag of ice and your skin.  An elastic wrap (like an Ace bandage)  may be used to keep swelling down.  Keep foot above the level of the heart, or at least raised on a footstool, when swelling and pain are present.  Try to avoid use other than gentle range of motion while the foot is painful. Do not resume use until instructed by your caregiver. Then begin use gradually, not increasing use to the point of pain. If pain does develop, decrease use and continue the above measures, gradually increasing activities that do not cause discomfort, until you gradually achieve normal use.  Use crutches if and as instructed, and for the length of time instructed.  Keep injured foot and ankle wrapped between treatments.  Massage foot and ankle for comfort and to keep swelling down. Massage from the toes up towards the knee.  Only take over-the-counter or prescription medicines for pain, discomfort, or fever as directed by your caregiver. SEEK IMMEDIATE MEDICAL CARE IF:   Your pain and swelling increase, or pain is not controlled with medications.  You have loss of feeling in your foot or your foot turns cold or blue.  You develop new, unexplained symptoms, or an increase of the symptoms that brought you to your caregiver. MAKE SURE YOU:   Understand these instructions.  Will watch your condition.  Will get help right away if you are not doing well or get worse. Document Released: 01/12/2002 Document Revised: 10/15/2011 Document Reviewed: 03/11/2008 [facility] Patient Information 2015 [place], [place]. This information is not intended to replace advice given to you by your health care provider. Make sure you discuss any questions you have with your health care provider.  Rib Contusion A rib contusion (bruise) can occur by a blow to the chest or by a fall against a hard object. Usually these will be much better in a couple weeks. If X-rays were taken today and there are no broken bones (fractures), the diagnosis of bruising is made. However, broken ribs may not show up  for several days, or may be discovered later on a routine X-ray when signs of healing show up. If this happens to you, it does not mean that something was missed on the X-ray, but simply that it did not show up on the first X-rays. Earlier diagnosis will not usually change the treatment. HOME CARE INSTRUCTIONS   Avoid strenuous activity. Be careful during activities and avoid bumping the injured ribs. Activities that pull on the injured ribs and cause pain should be avoided, if possible.  For the first day or two, an ice pack used every 20 minutes while awake may be helpful. Put ice in a plastic bag and put a towel between the bag and the skin.  Eat a normal, well-balanced diet. Drink plenty of fluids to avoid constipation.  Take deep breaths several times a day to  keep lungs free of infection. Try to cough several times a day. Splint the injured area with a pillow while coughing to ease pain. Coughing can help prevent pneumonia.  Wear a rib belt or binder only if told to do so by your caregiver. If you are wearing a rib belt or binder, you must do the breathing exercises as directed by your caregiver. If not used properly, rib belts or binders restrict breathing which can lead to pneumonia.  Only take over-the-counter or prescription medicines for pain, discomfort, or fever as directed by your caregiver. SEEK MEDICAL CARE IF:   You or your child has an oral temperature above 102 F (38.9 C).  Your baby is older than 3 months with a rectal temperature of 100.5 F (38.1 C) or higher for more than 1 day.  You develop a cough, with thick or bloody sputum. SEEK IMMEDIATE MEDICAL CARE IF:   You have difficulty breathing.  You feel sick to your stomach (nausea), have vomiting or belly (abdominal) pain.  You have worsening pain, not controlled with medications, or there is a change in the location of the pain.  You develop sweating or radiation of the pain into the arms, jaw or shoulders, or  become light headed or faint.  You or your child has an oral temperature above 102 F (38.9 C), not controlled by medicine.  Your or your baby is older than 3 months with a rectal temperature of 102 F (38.9 C) or higher.  Your baby is 54 months old or younger with a rectal temperature of 100.4 F (38 C) or higher. MAKE SURE YOU:   Understand these instructions.  Will watch your condition.  Will get help right away if you are not doing well or get worse. Document Released: 04/17/2001 Document Revised: 11/17/2012 Document Reviewed: 03/10/2008 [facility] Patient Information 2015 [place], [place]. This information is not intended to replace advice given to you by your health care provider. Make sure you discuss any questions you have with your health care provider.  Zygoma Fracture A fracture (break) in your cheekbone is also called a zygoma fracture. If this bone is in normal position, conservative treatment may be all that is necessary. This means an operation is not required. If this bone is displaced, surgery may be necessary to repair the fracture and get it back into position. This fracture is easily diagnosed with x-rays. LET YOUR CAREGIVER KNOW ABOUT:  Allergies.  Medications taken including herbs, eye drops, over the counter medications, and creams.  Use of steroids by mouth or creams.  Other health problems.  Possibility of pregnancy, if this applies.  History of blood clots (thrombophlebitis).  History of bleeding or blood problems.  Previous surgery.  Previous problems with anesthetics or novocaine. HOME CARE INSTRUCTIONS   You may resume normal diet and activities as directed or allowed.  Take prescribed medication as directed. Only take over-the-counter or prescription medicines for pain, discomfort, or fever as directed by your caregiver. Do not give aspirin to children less than 23 years of age unless advised by your caregiver because of the association with  Reye's Syndrome.  Apply ice to the areas of pain and swelling for 15-20 minutes every hour while awake, for 2 days. Put the ice in a plastic bag and place a thin towel between the bag of ice and your cast, splint, or wrap. SEEK MEDICAL CARE IF:   Increased pain, swelling, or bruising of the cheek that is not relieved with medication.  Increasing warmth or redness (inflammation) in the area of injury.  Problems with increasing swelling or bruising of the injured area.  You develop any visual problems.  You develop any leak or discharge of watery material from your nose. Document Released: 04/17/2001 Document Revised: 10/15/2011 Document Reviewed: 11/25/2007 [facility] Patient Information 2015 [place], [place]. This information is not intended to replace advice given to you by your health care provider. Make sure you discuss any questions you have with your health care provider.

## 2014-04-23 NOTE — ED Notes (Signed)
C-collar applied

## 2014-04-24 NOTE — ED Provider Notes (Signed)
Medical screening examination/treatment/procedure(s) were performed by non-physician practitioner and as supervising physician I was immediately available for consultation/collaboration.  Megan Docherty, MD 04/24/14 1016 

## 2014-05-04 ENCOUNTER — Encounter (HOSPITAL_COMMUNITY): Payer: Self-pay | Admitting: Emergency Medicine

## 2014-05-04 ENCOUNTER — Emergency Department (HOSPITAL_COMMUNITY)
Admission: EM | Admit: 2014-05-04 | Discharge: 2014-05-04 | Disposition: A | Discharge status: Home / Self Care | Payer: Self-pay | Attending: Emergency Medicine | Admitting: Emergency Medicine

## 2014-05-04 DIAGNOSIS — Z87828 Personal history of other (healed) physical injury and trauma: Secondary | ICD-10-CM | POA: Insufficient documentation

## 2014-05-04 DIAGNOSIS — M79609 Pain in unspecified limb: Secondary | ICD-10-CM | POA: Insufficient documentation

## 2014-05-04 DIAGNOSIS — F17200 Nicotine dependence, unspecified, uncomplicated: Secondary | ICD-10-CM | POA: Insufficient documentation

## 2014-05-04 DIAGNOSIS — S99922S Unspecified injury of left foot, sequela: Secondary | ICD-10-CM

## 2014-05-04 DIAGNOSIS — Z880 Allergy status to penicillin: Secondary | ICD-10-CM | POA: Insufficient documentation

## 2014-05-04 MED ORDER — HYDROCODONE-ACETAMINOPHEN 5-325 MG PO TABS
1.0000 | ORAL_TABLET | Freq: Once | ORAL | Status: AC
  Administered 2014-05-04: 1 via ORAL
  Filled 2014-05-04: qty 1

## 2014-05-04 MED ORDER — HYDROCODONE-ACETAMINOPHEN 5-325 MG PO TABS: 1.0000 | ORAL_TABLET | Freq: Four times a day (QID) | ORAL | Status: DC | PRN

## 2014-05-04 MED ORDER — IBUPROFEN 200 MG PO TABS: 600.0000 mg | ORAL_TABLET | Freq: Four times a day (QID) | ORAL | Status: DC | PRN
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
## 2014-05-04 NOTE — Discharge Instructions (Signed)
You do not have a fractured foot.  Please continue to wrap, it with the Ace bandage, elevate as much as possible.  Ice on top of the Ace bandage for 20 minutes at a time.  Take ibuprofen on a regular basis.  He also been given a prescription for Vicodin to use for severe pain.  You have been given a referral to orthopedics.  Please call and make an appointment time and he referred you should see, you within 24 hours reevaluation
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
## 2014-05-05 NOTE — ED Provider Notes (Signed)
Medical screening examination/treatment/procedure(s) were performed by non-physician practitioner and as supervising physician I was immediately available for consultation/collaboration.   EKG Interpretation None        Victor SkeensJoshua M Leonela Kivi, MD 05/05/14 1527

## 2014-05-31 ENCOUNTER — Emergency Department (INDEPENDENT_AMBULATORY_CARE_PROVIDER_SITE_OTHER)
Admission: EM | Admit: 2014-05-31 | Discharge: 2014-05-31 | Disposition: A | Discharge status: Home / Self Care | Payer: Self-pay | Source: Home / Self Care | Attending: Family Medicine | Admitting: Family Medicine

## 2014-05-31 ENCOUNTER — Encounter (HOSPITAL_COMMUNITY): Payer: Self-pay | Admitting: Emergency Medicine

## 2014-05-31 DIAGNOSIS — S9032XS Contusion of left foot, sequela: Secondary | ICD-10-CM

## 2014-05-31 MED ORDER — METHYLPREDNISOLONE 4 MG PO KIT
PACK | ORAL | Status: DC
Start: 2014-05-31 — End: 2016-04-25

## 2014-05-31 NOTE — Discharge Instructions (Signed)
Soak in warm water twice a day, take all of medicine, see podiatrist if further problems,

## 2014-05-31 NOTE — ED Provider Notes (Signed)
CSN: [id number]     Arrival date & time 05/31/14  1611 History   First MD Initiated Contact with Patient 05/31/14 1644     Chief Complaint  Patient presents with  . Foot Pain   (Consider location/radiation/quality/duration/timing/severity/associated sxs/prior Treatment) Patient is a 28 y.o. male presenting with lower extremity pain. The history is provided by the patient.  Foot Pain This is a recurrent problem. The current episode started more than 1 week ago (foot injury 1 mo ago, has not gotten ortho f/u because no ins. continues to c/o pain and sts.). The problem has not changed since onset.The symptoms are aggravated by walking.    Past Medical History  Diagnosis Date  . Heroin abuse    History reviewed. No pertinent past surgical history. No family history on file. History  Substance Use Topics  . Smoking status: Current Every Day Smoker  . Smokeless tobacco: Not on file  . Alcohol Use: Yes     Comment: Occasional    Review of Systems  Constitutional: Negative.   Musculoskeletal: Positive for gait problem and joint swelling.    Allergies  Amoxicillin  Home Medications   Prior to Admission medications   Medication Sig Start Date End Date Taking? Authorizing Provider  HYDROcodone-acetaminophen (NORCO/VICODIN) 5-325 MG per tablet Take 1 tablet by mouth every 6 (six) hours as needed for moderate pain. 05/04/14   Arman FilterGail K Schulz, NP  ibuprofen (ADVIL,MOTRIN) 200 MG tablet Take 3 tablets (600 mg total) by mouth every 6 (six) hours as needed for headache, mild pain or moderate pain. 05/04/14   Arman FilterGail K Schulz, NP  methylPREDNISolone (MEDROL DOSEPAK) 4 MG tablet follow package directions, start on tues, take until finished. 05/31/14   Linna HoffJames D Kindl, MD  oxyCODONE-acetaminophen (PERCOCET) 5-325 MG per tablet Take 1-2 tablets by mouth every 6 (six) hours as needed for severe pain. 04/23/14   Robyn M Hess, PA-C   BP 113/70  Pulse 76  Temp(Src) 98 F (36.7 C) (Oral)  Resp 14  SpO2  100% Physical Exam  Nursing note and vitals reviewed. Constitutional: He is oriented to person, place, and time. He appears well-developed and well-nourished.  Musculoskeletal: He exhibits tenderness.       Left foot: He exhibits tenderness, bony tenderness and swelling. He exhibits normal capillary refill and no deformity.       Feet:  Neurological: He is alert and oriented to person, place, and time.  Skin: Skin is warm and dry.    ED Course  Procedures (including critical care time) Labs Review Labs Reviewed - No data to display  Imaging Review No results found.   MDM   1. Contusion, foot, left, sequela        Linna HoffJames D Kindl, MD 05/31/14 [phone]
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
## 2016-04-25 ENCOUNTER — Encounter (HOSPITAL_COMMUNITY): Payer: Self-pay | Admitting: Emergency Medicine

## 2016-04-25 ENCOUNTER — Emergency Department (HOSPITAL_COMMUNITY)
Admission: EM | Admit: 2016-04-25 | Discharge: 2016-04-25 | Disposition: A | Discharge status: Home / Self Care | Payer: Self-pay | Attending: Emergency Medicine | Admitting: Emergency Medicine

## 2016-04-25 ENCOUNTER — Emergency Department (HOSPITAL_COMMUNITY): Payer: Self-pay

## 2016-04-25 DIAGNOSIS — F17200 Nicotine dependence, unspecified, uncomplicated: Secondary | ICD-10-CM | POA: Insufficient documentation

## 2016-04-25 DIAGNOSIS — T401X1A Poisoning by heroin, accidental (unintentional), initial encounter: Secondary | ICD-10-CM | POA: Insufficient documentation

## 2016-04-25 LAB — COMPREHENSIVE METABOLIC PANEL
ALK PHOS: 42 U/L (ref 38–126)
ALT: 80 U/L — ABNORMAL HIGH (ref 17–63)
ANION GAP: 4 — AB (ref 5–15)
AST: 48 U/L — ABNORMAL HIGH (ref 15–41)
Albumin: 3.8 g/dL (ref 3.5–5.0)
BUN: 14 mg/dL (ref 6–20)
CALCIUM: 8.8 mg/dL — AB (ref 8.9–10.3)
CO2: 30 mmol/L (ref 22–32)
Chloride: 107 mmol/L (ref 101–111)
Creatinine, Ser: 0.86 mg/dL (ref 0.61–1.24)
GFR calc non Af Amer: >60 mL/min (ref >60)
Glucose, Bld: 120 mg/dL — ABNORMAL HIGH (ref 65–99)
POTASSIUM: 3.6 mmol/L (ref 3.5–5.1)
Sodium: 141 mmol/L (ref 135–145)
Total Bilirubin: 0.7 mg/dL (ref 0.3–1.2)
Total Protein: 6.5 g/dL (ref 6.5–8.1)

## 2016-04-25 LAB — I-STAT ARTERIAL BLOOD GAS, ED
Bicarbonate: 25.6 mmol/L (ref 20.0–28.0)
O2 Saturation: 87 %
PH ART: 7.362 (ref 7.350–7.450)
TCO2: 27 mmol/L (ref 0–100)
pCO2 arterial: 45 mmHg (ref 32.0–48.0)
pO2, Arterial: 55 mmHg — ABNORMAL LOW (ref 83.0–108.0)

## 2016-04-25 LAB — CBC WITH DIFFERENTIAL/PLATELET
BASOS PCT: 0 %
Basophils Absolute: 0 10*3/uL (ref 0.0–0.1)
EOS PCT: 0 %
Eosinophils Absolute: 0 10*3/uL (ref 0.0–0.7)
HCT: 46.5 % (ref 39.0–52.0)
Hemoglobin: 15.4 g/dL (ref 13.0–17.0)
LYMPHS ABS: 0.8 10*3/uL (ref 0.7–4.0)
Lymphocytes Relative: 5 %
MCH: 31 pg (ref 26.0–34.0)
MCHC: 33.1 g/dL (ref 30.0–36.0)
MCV: 93.6 fL (ref 78.0–100.0)
MONO ABS: 0.8 10*3/uL (ref 0.1–1.0)
Monocytes Relative: 6 %
Neutro Abs: 12.7 10*3/uL — ABNORMAL HIGH (ref 1.7–7.7)
Neutrophils Relative %: 89 %
Platelets: 218 10*3/uL (ref 150–400)
RBC: 4.97 MIL/uL (ref 4.22–5.81)
RDW: 12.9 % (ref 11.5–15.5)
WBC: 14.3 10*3/uL — ABNORMAL HIGH (ref 4.0–10.5)

## 2016-04-25 LAB — ETHANOL: Alcohol, Ethyl (B): <5 mg/dL (ref <5)

## 2016-04-25 NOTE — ED Notes (Signed)
Patient transported to CT 

## 2016-04-25 NOTE — Discharge Instructions (Signed)
Do not hesitate to return to the emergency room for any new, worsening or concerning symptoms. ° °Please obtain primary care using resource guide below. Let them know that you were seen in the emergency room and that they will need to obtain records for further outpatient management. ° ° °

## 2016-04-25 NOTE — ED Provider Notes (Signed)
[facility]EMERGENCY DEPT Provider Note   CSN: [id number] Arrival date & time: 04/25/16  1227     History   Chief Complaint Chief Complaint  Patient presents with  . Drug Overdose    HPI  Blood pressure 120/87, pulse 89, temperature 98.3 F (36.8 C), temperature source Oral, SpO2 94 %.  Victor Adkins is a 30 y.o. 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 Patient states that he does inject heroin, states that his last use was this morning, is confused and asking what happened repeatedly. He denies any other drug use, alcohol use, denies that this is a suicide attempt, is unclear of the events leading up to him being in the porta potty. He states that he did inject heroin this a.m., does not remember if he did it in the porta potty or not.  HPI  Past Medical History:  Diagnosis Date  . Heroin abuse     There are no active problems to display for this patient.   History reviewed. No pertinent surgical history.     Home Medications    Prior to Admission medications   Not on File    Family History History reviewed. No pertinent family history.  Social History Social History  Substance Use Topics  . Smoking status: Current Every Day Smoker  . Smokeless tobacco: Not on file  . Alcohol use Yes     Comment: Occasional     Allergies   Amoxicillin   Review of Systems Review of Systems  10 systems reviewed and found to be negative, except as noted in the HPI.   Physical Exam Updated Vital Signs BP 117/75   Pulse 64   Temp 98.3 F (36.8 C) (Oral)   Resp 13   SpO2 99%   Physical Exam  Constitutional: He is oriented to person, place, and time. He appears well-developed and well-nourished.  HENT:  Head: Normocephalic and atraumatic.  Mouth/Throat: Oropharynx is clear and moist.    Eyes: Conjunctivae and EOM are normal. Pupils are equal, round, and reactive to light.  No TTP of maxillary or frontal sinuses  No TTP or induration of temporal arteries bilaterally  Neck: Normal range of motion. Neck supple.  FROM to C-spine. Pt can touch chin to chest without discomfort. No TTP of midline cervical spine.   Cardiovascular: Normal rate, regular rhythm and intact distal pulses.   Pulmonary/Chest: Effort normal and breath sounds normal. No respiratory distress. He has no wheezes. He has no rales. He exhibits no tenderness.  Abdominal: Soft. Bowel sounds are normal. There is no tenderness.  Musculoskeletal: Normal range of motion. He exhibits no edema or tenderness.  Neurological: He is alert and oriented to person, place, and time. No cranial nerve deficit.  Follows commands, Clear, goal oriented speech, Strength is 5 out of 5x4 extremities, patient ambulates with a coordinated in nonantalgic gait. Sensation is grossly intact.    Nursing note and vitals reviewed.    ED Treatments / Results  Labs (all labs ordered are listed, but only abnormal results are displayed) Labs Reviewed  CBC WITH DIFFERENTIAL/PLATELET - Abnormal; Notable for the following:       Result Value   WBC 14.3 (*)    Neutro Abs 12.7 (*)    All other components within normal limits  COMPREHENSIVE METABOLIC PANEL - Abnormal; Notable for the  following:    Glucose, Bld 120 (*)    Calcium 8.8 (*)    AST 48 (*)    ALT 80 (*)    Anion gap 4 (*)    All other components within normal limits  I-STAT ARTERIAL BLOOD GAS, ED - Abnormal; Notable for the following:    pO2, Arterial 55.0 (*)    All other components within normal limits  ETHANOL  URINE RAPID DRUG SCREEN, HOSP PERFORMED  BLOOD GAS, ARTERIAL    EKG  EKG Interpretation None       Radiology Ct Head Wo Contrast  Result Date: 04/25/2016 CLINICAL DATA:  Headache EXAM: CT HEAD WITHOUT CONTRAST TECHNIQUE: Contiguous axial images were  obtained from the base of the skull through the vertex without intravenous contrast. COMPARISON:  04/25/2016 FINDINGS: Brain: Ventricle size normal. Benign cyst right basal ganglia unchanged. Negative for acute infarct, hemorrhage, mass. No edema or shift of the midline structures. Vascular: No hyperdense vessel or unexpected calcification. Skull: Normal. Negative for fracture or focal lesion. Sinuses/Orbits: Retention cyst left maxillary sinus. Remaining sinuses clear. Other: Normal soft tissues. IMPRESSION: Normal CT of the brain. Electronically Signed   By: Marlan Palauharles  Clark M.D.   On: 04/25/2016 14:09    Procedures Procedures (including critical care time)  Medications Ordered in ED Medications - No data to display   Initial Impression / Assessment and Plan / ED Course  I have reviewed the triage vital signs and the nursing notes.  Pertinent labs & imaging results that were available during my care of the patient were reviewed by me and considered in my medical decision making (see chart for details).  Clinical Course     Vitals:   04/25/16 1430 04/25/16 1445 04/25/16 1500 04/25/16 1515  BP: 122/73 112/73 114/73 117/75  Pulse: 62 68 63 64  Resp: 13 14 13 13   Temp:      TempSrc:      SpO2: 99% 97% 95% 99%    Victor Adkins is 30 y.o. male BIBEMS after being found down in a porta potty at work at his [employer] site, he states that he is a IV heroin user however he does appear slightly confused, with repetitive questioning, nonfocal neuro exam, denies any other drug use. Head CT negative, ABG with a low oxygen, ethanol level negative.  After observation in the ED, repetitive questioning his sees, appears more alert and coordinated. Don't think the Narcan will have to be redosed.  This is a shared visit with the attending physician who personally evaluated the patient and agrees with the care plan.   Evaluation does not show pathology that would require ongoing emergent  intervention or inpatient treatment. Pt is hemodynamically stable and mentating appropriately. Discussed findings and plan with patient/guardian, who agrees with care plan. All questions answered. Return precautions discussed and outpatient follow up given.   Final Clinical Impressions(s) / ED Diagnoses   Final diagnoses:  Heroin overdose, accidental or unintentional, initial encounter    New Prescriptions New Prescriptions   No medications on file     Wynetta Emeryicole Yancy Knoble, PA-C 04/25/16 1527    Tilden FossaElizabeth Rees, MD 04/26/16 0700

## 2016-04-25 NOTE — ED Triage Notes (Signed)
Per EMS: pt found unresponsive in porta john and resp 4-6 per/min with cyanosis; pt given 0.5mg  narcan; IV 18 L hand; pt alert at present and admits to heroin use today

## 2016-05-16 ENCOUNTER — Emergency Department (HOSPITAL_COMMUNITY): Payer: Self-pay

## 2016-05-16 ENCOUNTER — Emergency Department (HOSPITAL_COMMUNITY)
Admission: EM | Admit: 2016-05-16 | Discharge: 2016-05-16 | Disposition: A | Discharge status: Home / Self Care | Payer: Self-pay | Attending: Emergency Medicine | Admitting: Emergency Medicine

## 2016-05-16 ENCOUNTER — Encounter (HOSPITAL_COMMUNITY): Payer: Self-pay | Admitting: *Deleted

## 2016-05-16 DIAGNOSIS — R112 Nausea with vomiting, unspecified: Secondary | ICD-10-CM | POA: Insufficient documentation

## 2016-05-16 DIAGNOSIS — F17200 Nicotine dependence, unspecified, uncomplicated: Secondary | ICD-10-CM | POA: Insufficient documentation

## 2016-05-16 DIAGNOSIS — J069 Acute upper respiratory infection, unspecified: Secondary | ICD-10-CM | POA: Insufficient documentation

## 2016-05-16 LAB — URINALYSIS, ROUTINE W REFLEX MICROSCOPIC
Bilirubin Urine: NEGATIVE
GLUCOSE, UA: NEGATIVE mg/dL
Hgb urine dipstick: NEGATIVE
Ketones, ur: 15 mg/dL — AB
Nitrite: NEGATIVE
Protein, ur: NEGATIVE mg/dL
SPECIFIC GRAVITY, URINE: 1.039 — AB (ref 1.005–1.030)
pH: 7 (ref 5.0–8.0)

## 2016-05-16 LAB — CBC
HCT: 48.3 % (ref 39.0–52.0)
HEMOGLOBIN: 16.5 g/dL (ref 13.0–17.0)
MCH: 31.2 pg (ref 26.0–34.0)
MCHC: 34.2 g/dL (ref 30.0–36.0)
MCV: 91.3 fL (ref 78.0–100.0)
Platelets: 240 10*3/uL (ref 150–400)
RBC: 5.29 MIL/uL (ref 4.22–5.81)
RDW: 13 % (ref 11.5–15.5)
WBC: 9.1 10*3/uL (ref 4.0–10.5)

## 2016-05-16 LAB — URINE MICROSCOPIC-ADD ON: RBC / HPF: NONE SEEN RBC/hpf (ref 0–5)

## 2016-05-16 LAB — COMPREHENSIVE METABOLIC PANEL
ALT: 61 U/L (ref 17–63)
ANION GAP: 9 (ref 5–15)
AST: 39 U/L (ref 15–41)
Albumin: 4 g/dL (ref 3.5–5.0)
Alkaline Phosphatase: 44 U/L (ref 38–126)
BUN: 13 mg/dL (ref 6–20)
CHLORIDE: 107 mmol/L (ref 101–111)
CO2: 23 mmol/L (ref 22–32)
Calcium: 9.4 mg/dL (ref 8.9–10.3)
Creatinine, Ser: 0.86 mg/dL (ref 0.61–1.24)
Glucose, Bld: 97 mg/dL (ref 65–99)
POTASSIUM: 3.8 mmol/L (ref 3.5–5.1)
SODIUM: 139 mmol/L (ref 135–145)
Total Bilirubin: 1.1 mg/dL (ref 0.3–1.2)
Total Protein: 7.1 g/dL (ref 6.5–8.1)

## 2016-05-16 LAB — RAPID STREP SCREEN (MED CTR MEBANE ONLY): Streptococcus, Group A Screen (Direct): NEGATIVE

## 2016-05-16 LAB — LIPASE, BLOOD: LIPASE: 16 U/L (ref 11–51)

## 2016-05-16 MED ORDER — ONDANSETRON 4 MG PO TBDP: 4.0000 mg | ORAL_TABLET | Freq: Three times a day (TID) | ORAL | 0 refills | Status: AC | PRN

## 2016-05-16 MED ORDER — SODIUM CHLORIDE 0.9 % IV BOLUS (SEPSIS)
1000.0000 mL | Freq: Once | INTRAVENOUS | Status: AC
  Administered 2016-05-16: 1000 mL via INTRAVENOUS

## 2016-05-16 MED ORDER — CETIRIZINE HCL 10 MG PO TABS: 10.0000 mg | ORAL_TABLET | Freq: Every day | ORAL | 1 refills | Status: AC

## 2016-05-16 MED ORDER — FLUTICASONE PROPIONATE 50 MCG/ACT NA SUSP: 2.0000 | Freq: Every day | NASAL | 0 refills | Status: AC

## 2016-05-16 MED ORDER — BENZONATATE 100 MG PO CAPS: 100.0000 mg | ORAL_CAPSULE | Freq: Three times a day (TID) | ORAL | 0 refills | Status: AC

## 2016-05-16 MED ORDER — ACETAMINOPHEN 325 MG PO TABS
650.0000 mg | ORAL_TABLET | Freq: Once | ORAL | Status: AC
  Administered 2016-05-16: 650 mg via ORAL
  Filled 2016-05-16: qty 2

## 2016-05-16 MED ORDER — ONDANSETRON HCL 4 MG/2ML IJ SOLN
4.0000 mg | Freq: Once | INTRAMUSCULAR | Status: AC
  Administered 2016-05-16: 4 mg via INTRAVENOUS
  Filled 2016-05-16: qty 2

## 2016-05-16 NOTE — ED Triage Notes (Addendum)
Pt reports bodyaches, headache, fever/chills, sore throat,  vomiting x 4 days. Mask on pt at triage.

## 2016-05-16 NOTE — ED Provider Notes (Signed)
[facility] Provider Note   CSN: [id number] Arrival date & time: 05/16/16  1352     History   Chief Complaint Chief Complaint  Patient presents with  . Emesis  . Fever  . Headache    HPI Victor Adkins is a 30 y.o. male.  Victor Adkins is a 30 y.o. Male who presents to the ED complaining of cough, sneezing, runny nose, postnasal drip, sore throat and subjective fever over the past 4 days. He also reports today he has had some nausea and vomiting for the past two days without abdominal pain. He reports chills last night. He also reports a headache last night that has since resolved. He took cold and flu medicine last night with little relief. No treatments today. No antipyretics today. Patient denies trouble swallowing, shortness of breath, chest pain, wheezing, abdominal pain, diarrhea, hematemesis, urinary symptoms, previous abdominal surgeries,, neck stiffness, headache, or rashes. He denies heroin use since his overdose last month.   The history is provided by the patient. No language interpreter was used.  Emesis   Associated symptoms include chills and cough. Pertinent negatives include no abdominal pain, no diarrhea, no fever and no headaches.  Fever   Associated symptoms include vomiting, congestion, sore throat and cough. Pertinent negatives include no chest pain, no diarrhea and no headaches.  Headache   Associated symptoms include nausea and vomiting. Pertinent negatives include no fever, no palpitations and no shortness of breath.    Past Medical History:  Diagnosis Date  . Heroin abuse     There are no active problems to display for this patient.   History reviewed. No pertinent surgical history.     Home Medications    Prior to Admission medications   Medication Sig Start Date End Date Taking? Authorizing Provider  benzonatate (TESSALON) 100 MG capsule Take 1 capsule (100 mg total) by mouth every 8 (eight) hours. 05/16/16   Everlene Farrier, PA-C  cetirizine (ZYRTEC ALLERGY) 10 MG tablet Take 1 tablet (10 mg total) by mouth daily. 05/16/16   Everlene Farrier, PA-C  fluticasone (FLONASE) 50 MCG/ACT nasal spray Place 2 sprays into both nostrils daily. 05/16/16   Everlene Farrier, PA-C  ondansetron (ZOFRAN ODT) 4 MG disintegrating tablet Take 1 tablet (4 mg total) by mouth every 8 (eight) hours as needed for nausea or vomiting. 05/16/16   Everlene Farrier, PA-C    Family History History reviewed. No pertinent family history.  Social History Social History  Substance Use Topics  . Smoking status: Current Every Day Smoker  . Smokeless tobacco: Not on file  . Alcohol use Yes     Comment: Occasional     Allergies   Amoxicillin   Review of Systems Review of Systems  Constitutional: Positive for chills. Negative for fever.  HENT: Positive for congestion, postnasal drip, rhinorrhea, sneezing and sore throat. Negative for ear discharge, ear pain, trouble swallowing and voice change.   Eyes: Negative for visual disturbance.  Respiratory: Positive for cough. Negative for shortness of breath and wheezing.   Cardiovascular: Negative for chest pain and palpitations.  Gastrointestinal: Positive for nausea and vomiting. Negative for abdominal pain, blood in stool and diarrhea.  Genitourinary: Negative for difficulty urinating and dysuria.  Musculoskeletal: Negative for back pain, neck pain and neck stiffness.  Skin: Negative for rash.  Neurological: Negative for syncope, light-headedness and headaches.     Physical Exam Updated Vital Signs BP 112/78 (BP Location: Right Arm)   Pulse 61   Temp 98 F (  36.7 C) (Oral)   Resp 20   SpO2 95%   Physical Exam  Constitutional: He is oriented to person, place, and time. He appears well-developed and well-nourished. No distress.  Nontoxic appearing.  HENT:  Head: Normocephalic and atraumatic.  Right Ear: External ear normal.  Left Ear: External ear normal.  Mouth/Throat:  Oropharynx is clear and moist.  Bilateral tympanic membranes are pearly-gray without erythema or loss of landmarks. No tonsillar hypertrophy or exudates. Rhinorrhea present.  Eyes: Conjunctivae are normal. Pupils are equal, round, and reactive to light. Right eye exhibits no discharge. Left eye exhibits no discharge.  Neck: Normal range of motion. Neck supple. No JVD present.  No meningeal signs.  Cardiovascular: Normal rate, regular rhythm, normal heart sounds and intact distal pulses.  Exam reveals no gallop and no friction rub.   No murmur heard. Pulmonary/Chest: Effort normal and breath sounds normal. No stridor. No respiratory distress. He has no wheezes. He has no rales.  Lungs clear to auscultation bilaterally.  Abdominal: Soft. Bowel sounds are normal. He exhibits no distension and no mass. There is no tenderness. There is no rebound and no guarding.  Abdomen is soft and nontender to palpation. Bowel sounds are present.  Musculoskeletal: He exhibits no edema or tenderness.  No lower extremity edema or tenderness.  Lymphadenopathy:    He has no cervical adenopathy.  Neurological: He is alert and oriented to person, place, and time. Coordination normal.  Skin: Skin is warm and dry. Capillary refill takes less than 2 seconds. No rash noted. He is not diaphoretic. No erythema. No pallor.  Psychiatric: He has a normal mood and affect. His behavior is normal.  Nursing note and vitals reviewed.    ED Treatments / Results  Labs (all labs ordered are listed, but only abnormal results are displayed) Labs Reviewed  URINALYSIS, ROUTINE W REFLEX MICROSCOPIC (NOT AT [facility]) - Abnormal; Notable for the following:       Result Value   Specific Gravity, Urine 1.039 (*)    Ketones, ur 15 (*)    Leukocytes, UA TRACE (*)    All other components within normal limits  URINE MICROSCOPIC-ADD ON - Abnormal; Notable for the following:    Squamous Epithelial / LPF 0-5 (*)    Bacteria, UA RARE (*)     All other components within normal limits  RAPID STREP SCREEN (NOT AT [facility])  CULTURE, GROUP A STREP ([facility])  LIPASE, BLOOD  COMPREHENSIVE METABOLIC PANEL  CBC    EKG  EKG Interpretation None       Radiology Dg Chest 2 View  Result Date: 05/16/2016 CLINICAL DATA:  Cough for 4-5 days EXAM: CHEST  2 VIEW COMPARISON:  04/23/2014 FINDINGS: The heart size and mediastinal contours are within normal limits. Both lungs are clear. The visualized skeletal structures are unremarkable. IMPRESSION: No active cardiopulmonary disease. Electronically Signed   By: Jasmine Pang M.D.   On: 05/16/2016 20:01    Procedures Procedures (including critical care time)  Medications Ordered in ED Medications  sodium chloride 0.9 % bolus 1,000 mL (1,000 mLs Intravenous New Bag/Given 05/16/16 1904)  ondansetron (ZOFRAN) injection 4 mg (4 mg Intravenous Given 05/16/16 1905)  acetaminophen (TYLENOL) tablet 650 mg (650 mg Oral Given 05/16/16 1906)     Initial Impression / Assessment and Plan / ED Course  I have reviewed the triage vital signs and the nursing notes.  Pertinent labs & imaging results that were available during my care of the patient were reviewed by  me and considered in my medical decision making (see chart for details).  Clinical Course   This is a 30 y.o. Male who presents to the ED complaining of cough, sneezing, runny nose, postnasal drip, sore throat and subjective fever over the past 4 days. He also reports today he has had some nausea and vomiting for the past two days without abdominal pain. He reports chills last night. He also reports a headache last night that has since resolved. He took cold and flu medicine last night with little relief. No treatments today. No antipyretics today. No fever in the ER.  On exam patient is afebrile nontoxic appearing. Throat is clear. Rhinorrhea present. Lungs clear to auscultation bilaterally. Abdomen is soft and nontender to palpation. No meningeal  signs. No concern for meningitis. Rapid strep test is negative. Urinalysis shows nitrite negative with trace leukocytes. Patient without urinary symptoms. Lipase was within normal limits. His CMP is within normal limits. CBC is unremarkable. No leukocytosis. Chest x-ray is unremarkable. Patient with upper respiratory infection with some associated nausea and vomiting. In the emergency department he has had no vomiting. He tolerated ginger ale and graham crackers without abdominal pain, nausea or vomiting. Start the patient on Flonase, Zyrtec, Tessalon Perles and Zofran. I discussed strict and specific return precautions. I advised the patient to follow-up with their primary care provider this week. I advised the patient to return to the emergency department with new or worsening symptoms or new concerns. The patient verbalized understanding and agreement with plan.     Final Clinical Impressions(s) / ED Diagnoses   Final diagnoses:  Viral upper respiratory tract infection  Non-intractable vomiting with nausea, unspecified vomiting type    New Prescriptions New Prescriptions   BENZONATATE (TESSALON) 100 MG CAPSULE    Take 1 capsule (100 mg total) by mouth every 8 (eight) hours.   CETIRIZINE (ZYRTEC ALLERGY) 10 MG TABLET    Take 1 tablet (10 mg total) by mouth daily.   FLUTICASONE (FLONASE) 50 MCG/ACT NASAL SPRAY    Place 2 sprays into both nostrils daily.   ONDANSETRON (ZOFRAN ODT) 4 MG DISINTEGRATING TABLET    Take 1 tablet (4 mg total) by mouth every 8 (eight) hours as needed for nausea or vomiting.     Everlene FarrierWilliam Kateri Balch, PA-C 05/16/16 2056    Arby BarretteMarcy Pfeiffer, MD 05/17/16 [phone]

## 2016-05-16 NOTE — ED Notes (Signed)
Pt showing NAD. RR even and unlabored. Voices no questions/concerns at this time. 

## 2016-05-18 LAB — CULTURE, GROUP A STREP (THRC)
